# Patient Record
Sex: Female | Born: 1935 | Race: White | Hispanic: No | Smoking: Never smoker
Health system: Southern US, Community
[De-identification: ages and names within clinical notes are randomized; demographics above are authoritative.]

## PROBLEM LIST (undated history)

## (undated) DIAGNOSIS — I10 Essential (primary) hypertension: Secondary | ICD-10-CM

## (undated) DIAGNOSIS — I509 Heart failure, unspecified: Secondary | ICD-10-CM

## (undated) DIAGNOSIS — G589 Mononeuropathy, unspecified: Secondary | ICD-10-CM

## (undated) DIAGNOSIS — F0390 Unspecified dementia without behavioral disturbance: Secondary | ICD-10-CM

## (undated) DIAGNOSIS — I219 Acute myocardial infarction, unspecified: Secondary | ICD-10-CM

## (undated) DIAGNOSIS — I4891 Unspecified atrial fibrillation: Secondary | ICD-10-CM

## (undated) DIAGNOSIS — E119 Type 2 diabetes mellitus without complications: Secondary | ICD-10-CM

## (undated) DIAGNOSIS — E039 Hypothyroidism, unspecified: Secondary | ICD-10-CM

## (undated) HISTORY — PX: APPENDECTOMY: SHX54

## (undated) HISTORY — PX: BACK SURGERY: SHX140

## (undated) HISTORY — PX: THYROIDECTOMY, PARTIAL: SHX18

---

## 2018-10-14 ENCOUNTER — Observation Stay (HOSPITAL_COMMUNITY): Payer: Medicare Other

## 2018-10-14 ENCOUNTER — Encounter (HOSPITAL_COMMUNITY): Payer: Self-pay | Admitting: Emergency Medicine

## 2018-10-14 ENCOUNTER — Other Ambulatory Visit: Payer: Self-pay

## 2018-10-14 ENCOUNTER — Inpatient Hospital Stay (HOSPITAL_COMMUNITY)
Admission: EM | Admit: 2018-10-14 | Discharge: 2018-10-20 | DRG: 640 | Disposition: A | Discharge status: Home Health | Payer: Medicare Other | Source: Ambulatory Visit | Attending: Internal Medicine | Admitting: Internal Medicine

## 2018-10-14 DIAGNOSIS — T502X5A Adverse effect of carbonic-anhydrase inhibitors, benzothiadiazides and other diuretics, initial encounter: Secondary | ICD-10-CM | POA: Diagnosis present

## 2018-10-14 DIAGNOSIS — R001 Bradycardia, unspecified: Secondary | ICD-10-CM | POA: Diagnosis present

## 2018-10-14 DIAGNOSIS — E039 Hypothyroidism, unspecified: Secondary | ICD-10-CM | POA: Diagnosis present

## 2018-10-14 DIAGNOSIS — G9340 Encephalopathy, unspecified: Secondary | ICD-10-CM | POA: Diagnosis not present

## 2018-10-14 DIAGNOSIS — I5030 Unspecified diastolic (congestive) heart failure: Secondary | ICD-10-CM | POA: Diagnosis present

## 2018-10-14 DIAGNOSIS — Z20828 Contact with and (suspected) exposure to other viral communicable diseases: Secondary | ICD-10-CM | POA: Diagnosis present

## 2018-10-14 DIAGNOSIS — E785 Hyperlipidemia, unspecified: Secondary | ICD-10-CM | POA: Diagnosis present

## 2018-10-14 DIAGNOSIS — Z9181 History of falling: Secondary | ICD-10-CM

## 2018-10-14 DIAGNOSIS — E871 Hypo-osmolality and hyponatremia: Principal | ICD-10-CM | POA: Diagnosis present

## 2018-10-14 DIAGNOSIS — I252 Old myocardial infarction: Secondary | ICD-10-CM

## 2018-10-14 DIAGNOSIS — N183 Chronic kidney disease, stage 3 unspecified: Secondary | ICD-10-CM | POA: Diagnosis present

## 2018-10-14 DIAGNOSIS — G9341 Metabolic encephalopathy: Secondary | ICD-10-CM | POA: Diagnosis present

## 2018-10-14 DIAGNOSIS — I5032 Chronic diastolic (congestive) heart failure: Secondary | ICD-10-CM | POA: Diagnosis present

## 2018-10-14 DIAGNOSIS — W19XXXA Unspecified fall, initial encounter: Secondary | ICD-10-CM

## 2018-10-14 DIAGNOSIS — Z7982 Long term (current) use of aspirin: Secondary | ICD-10-CM

## 2018-10-14 DIAGNOSIS — R634 Abnormal weight loss: Secondary | ICD-10-CM | POA: Diagnosis present

## 2018-10-14 DIAGNOSIS — Z66 Do not resuscitate: Secondary | ICD-10-CM | POA: Diagnosis present

## 2018-10-14 DIAGNOSIS — F0390 Unspecified dementia without behavioral disturbance: Secondary | ICD-10-CM | POA: Diagnosis present

## 2018-10-14 DIAGNOSIS — Z681 Body mass index (BMI) 19 or less, adult: Secondary | ICD-10-CM

## 2018-10-14 DIAGNOSIS — D649 Anemia, unspecified: Secondary | ICD-10-CM | POA: Diagnosis present

## 2018-10-14 DIAGNOSIS — Z79890 Hormone replacement therapy (postmenopausal): Secondary | ICD-10-CM

## 2018-10-14 DIAGNOSIS — I130 Hypertensive heart and chronic kidney disease with heart failure and stage 1 through stage 4 chronic kidney disease, or unspecified chronic kidney disease: Secondary | ICD-10-CM | POA: Diagnosis present

## 2018-10-14 HISTORY — DX: Essential (primary) hypertension: I10

## 2018-10-14 HISTORY — DX: Acute myocardial infarction, unspecified: I21.9

## 2018-10-14 HISTORY — DX: Heart failure, unspecified: I50.9

## 2018-10-14 HISTORY — DX: Mononeuropathy, unspecified: G58.9

## 2018-10-14 HISTORY — DX: Unspecified dementia, unspecified severity, without behavioral disturbance, psychotic disturbance, mood disturbance, and anxiety: F03.90

## 2018-10-14 HISTORY — DX: Hypothyroidism, unspecified: E03.9

## 2018-10-14 LAB — COMPREHENSIVE METABOLIC PANEL
ALT: 17 U/L (ref 0–44)
AST: 38 U/L (ref 15–41)
Albumin: 3.7 g/dL (ref 3.5–5.0)
Alkaline Phosphatase: 53 U/L (ref 38–126)
Anion gap: 11 (ref 5–15)
BUN: 21 mg/dL (ref 8–23)
CO2: 24 mmol/L (ref 22–32)
Calcium: 9.6 mg/dL (ref 8.9–10.3)
Chloride: 82 mmol/L — ABNORMAL LOW (ref 98–111)
Creatinine, Ser: 1.82 mg/dL — ABNORMAL HIGH (ref 0.44–1.00)
GFR calc Af Amer: 28 mL/min — ABNORMAL LOW (ref >60)
GFR calc non Af Amer: 24 mL/min — ABNORMAL LOW (ref >60)
Glucose, Bld: 137 mg/dL — ABNORMAL HIGH (ref 70–99)
Potassium: 5.1 mmol/L (ref 3.5–5.1)
Sodium: 117 mmol/L — CL (ref 135–145)
Total Bilirubin: 0.6 mg/dL (ref 0.3–1.2)
Total Protein: 6.4 g/dL — ABNORMAL LOW (ref 6.5–8.1)

## 2018-10-14 LAB — CBC WITH DIFFERENTIAL/PLATELET
Abs Immature Granulocytes: 0.01 10*3/uL (ref 0.00–0.07)
Basophils Absolute: 0 10*3/uL (ref 0.0–0.1)
Basophils Relative: 0 %
Eosinophils Absolute: 0 10*3/uL (ref 0.0–0.5)
Eosinophils Relative: 1 %
HCT: 31.5 % — ABNORMAL LOW (ref 36.0–46.0)
Hemoglobin: 11.4 g/dL — ABNORMAL LOW (ref 12.0–15.0)
Immature Granulocytes: 0 %
Lymphocytes Relative: 21 %
Lymphs Abs: 1.2 10*3/uL (ref 0.7–4.0)
MCH: 32.4 pg (ref 26.0–34.0)
MCHC: 36.2 g/dL — ABNORMAL HIGH (ref 30.0–36.0)
MCV: 89.5 fL (ref 80.0–100.0)
Monocytes Absolute: 0.5 10*3/uL (ref 0.1–1.0)
Monocytes Relative: 9 %
Neutro Abs: 3.8 10*3/uL (ref 1.7–7.7)
Neutrophils Relative %: 69 %
Platelets: 234 10*3/uL (ref 150–400)
RBC: 3.52 MIL/uL — ABNORMAL LOW (ref 3.87–5.11)
RDW: 11.4 % — ABNORMAL LOW (ref 11.5–15.5)
WBC: 5.5 10*3/uL (ref 4.0–10.5)
nRBC: 0 % (ref 0.0–0.2)

## 2018-10-14 MED ORDER — SODIUM CHLORIDE 0.9 % IV BOLUS
1000.0000 mL | Freq: Once | INTRAVENOUS | Status: AC
  Administered 2018-10-14: 1000 mL via INTRAVENOUS

## 2018-10-14 MED ORDER — VITAMIN B-12 1000 MCG PO TABS
1000.0000 ug | ORAL_TABLET | Freq: Every day | ORAL | Status: DC
  Administered 2018-10-15 – 2018-10-20 (×6): 1000 ug via ORAL
  Filled 2018-10-14 (×6): qty 1

## 2018-10-14 MED ORDER — ONDANSETRON HCL 4 MG PO TABS: 4.0000 mg | ORAL_TABLET | Freq: Four times a day (QID) | ORAL | Status: DC | PRN

## 2018-10-14 MED ORDER — LEVOTHYROXINE SODIUM 75 MCG PO TABS
75.0000 ug | ORAL_TABLET | Freq: Every day | ORAL | Status: DC
  Administered 2018-10-15 – 2018-10-19 (×5): 75 ug via ORAL
  Filled 2018-10-14 (×5): qty 1

## 2018-10-14 MED ORDER — GABAPENTIN 300 MG PO CAPS
300.0000 mg | ORAL_CAPSULE | Freq: Three times a day (TID) | ORAL | Status: DC
  Administered 2018-10-15: 300 mg via ORAL
  Filled 2018-10-14 (×2): qty 1

## 2018-10-14 MED ORDER — OMEGA-3-ACID ETHYL ESTERS 1 G PO CAPS
1.0000 g | ORAL_CAPSULE | Freq: Every day | ORAL | Status: DC
  Administered 2018-10-15 – 2018-10-20 (×6): 1 g via ORAL
  Filled 2018-10-14 (×6): qty 1

## 2018-10-14 MED ORDER — SACCHAROMYCES BOULARDII 250 MG PO CAPS
250.0000 mg | ORAL_CAPSULE | Freq: Two times a day (BID) | ORAL | Status: DC
  Administered 2018-10-15 – 2018-10-20 (×11): 250 mg via ORAL
  Filled 2018-10-14 (×11): qty 1

## 2018-10-14 MED ORDER — SIMVASTATIN 20 MG PO TABS
20.0000 mg | ORAL_TABLET | Freq: Every evening | ORAL | Status: DC
  Administered 2018-10-15 – 2018-10-19 (×5): 20 mg via ORAL
  Filled 2018-10-14 (×5): qty 1

## 2018-10-14 MED ORDER — ACETAMINOPHEN 325 MG PO TABS: 650.0000 mg | ORAL_TABLET | Freq: Four times a day (QID) | ORAL | Status: DC | PRN

## 2018-10-14 MED ORDER — HEPARIN SODIUM (PORCINE) 5000 UNIT/ML IJ SOLN
5000.0000 [IU] | Freq: Three times a day (TID) | INTRAMUSCULAR | Status: DC
  Administered 2018-10-15 – 2018-10-20 (×17): 5000 [IU] via SUBCUTANEOUS
  Filled 2018-10-14 (×15): qty 1

## 2018-10-14 MED ORDER — MIRABEGRON ER 25 MG PO TB24
25.0000 mg | ORAL_TABLET | Freq: Every day | ORAL | Status: DC
  Administered 2018-10-15 – 2018-10-20 (×6): 25 mg via ORAL
  Filled 2018-10-14 (×6): qty 1

## 2018-10-14 MED ORDER — DICLOFENAC SODIUM 1 % TD GEL
2.0000 g | Freq: Every day | TRANSDERMAL | Status: DC | PRN
  Filled 2018-10-14: qty 100

## 2018-10-14 MED ORDER — ACETAMINOPHEN 650 MG RE SUPP: 650.0000 mg | Freq: Four times a day (QID) | RECTAL | Status: DC | PRN

## 2018-10-14 MED ORDER — ASPIRIN 81 MG PO CHEW
81.0000 mg | CHEWABLE_TABLET | Freq: Every day | ORAL | Status: DC
  Administered 2018-10-15 – 2018-10-20 (×6): 81 mg via ORAL
  Filled 2018-10-14 (×6): qty 1

## 2018-10-14 MED ORDER — ONDANSETRON HCL 4 MG/2ML IJ SOLN: 4.0000 mg | Freq: Four times a day (QID) | INTRAMUSCULAR | Status: DC | PRN

## 2018-10-14 MED ORDER — PSYLLIUM 95 % PO PACK
1.0000 | PACK | Freq: Every day | ORAL | Status: DC
  Administered 2018-10-16 – 2018-10-20 (×5): 1 via ORAL
  Filled 2018-10-14 (×7): qty 1

## 2018-10-14 MED ORDER — BENAZEPRIL HCL 20 MG PO TABS
20.0000 mg | ORAL_TABLET | Freq: Every day | ORAL | Status: DC
  Filled 2018-10-14: qty 1

## 2018-10-14 NOTE — H&P (Signed)
History and Physical    Grace Owens ZOX:[id number] DOB: [birth year] DOA: 10/14/2018  PCP: System, Pcp Not In  Patient coming from: Home.  Chief Complaint: Low sodium.  Increasing weakness and confusion.  History obtained from patient's daughter.  HPI: Grace Owens is a 85 y.o. female with history of dementia, diastolic CHF, hypothyroidism, chronic kidney disease stage III and anemia was brought to the ER after patient's labs done by the primary care physician showed low sodium of 115.  As per the patient's daughter patient has been feeling more weak and confused than usual for the last couple weeks and has not been eating well.  Labs done by the primary care physician on September 27, 2018 showed sodium of 125 at that time patient was encouraged to have more liquids.  Patient was taking more free fluid than usual.  Since patient symptoms were getting worse including increasing confusion also had a fall 2 days ago when patient's family came to check on her.  Patient was taken to the primary care physician repeat labs sodium 115.  Creatinine 1.7.  Results are available in care everywhere.  Potassium was 5.5.  Patient was placed on eplerenone about 9 months ago.  Also takes hydrochlorothiazide.  Has not had any nausea vomiting abdominal pain diarrhea chest pain or shortness of breath.  ED Course: In the ER patient is alert awake oriented to her name.  Is moving all extremities.  Labs show sodium of 117 potassium 5.1 creatinine 1.8 hemoglobin 9.4 platelets 234.  Patient was given 1 L fluid bolus in the ER.  And admitted for further management of hyponatremia with encephalopathy weakness and fatigue.  Review of Systems: As per HPI, rest all negative.   Past Medical History:  Diagnosis Date  . CHF (congestive heart failure) (HCC)   . Dementia (HCC)   . Hypertension   . Hypothyroidism   . Myocardial infarct (HCC)   . Pinched nerve     Past Surgical History:  Procedure Laterality Date   . APPENDECTOMY    . BACK SURGERY    . THYROIDECTOMY, PARTIAL       reports that she has never smoked. She has never used smokeless tobacco. No history on file for alcohol and drug.  No Known Allergies  Family History  Problem Relation Age of Onset  . Dementia Neg Hx     Prior to Admission medications   Medication Sig Start Date End Date Taking? Authorizing Provider  acetaminophen (TYLENOL) 650 MG CR tablet Take 650 mg by mouth daily.   Yes [provider]  aspirin 81 MG chewable tablet Chew 81 mg by mouth daily.   Yes [provider]  benazepril (LOTENSIN) 20 MG tablet Take 20 mg by mouth daily.   Yes [provider]  bisoprolol-hydrochlorothiazide (ZIAC) 10-6.25 MG tablet Take 1 tablet by mouth daily.   Yes [provider]  Calcium Carbonate-Vitamin D (CALCIUM-VITAMIN D3 PO) Take 1 tablet by mouth every evening.   Yes [provider]  Coenzyme Q10 (COQ10) 100 MG CAPS Take 1 tablet by mouth daily.   Yes [provider]  diclofenac sodium (VOLTAREN) 1 % GEL Apply 2 g topically daily as needed (apply to left knee for pain).   Yes [provider]  eplerenone (INSPRA) 25 MG tablet Take 25 mg by mouth every evening.   Yes [provider]  gabapentin (NEURONTIN) 300 MG capsule Take 300 mg by mouth 3 (three) times daily.   Yes [provider]  Glucosamine-Chondroitin 250-200 MG TABS Take 1 tablet by mouth daily.   Yes [provider]  levothyroxine (SYNTHROID) 75 MCG tablet Take 75 mcg by mouth at bedtime.   Yes [provider]  LUTEIN PO Take 1 tablet by mouth daily.   Yes [provider]  mirabegron ER (MYRBETRIQ) 25 MG TB24 tablet Take 25 mg by mouth daily.   Yes [provider]  Multiple Vitamin (MULTIVITAMIN WITH MINERALS) TABS tablet Take 1 tablet by mouth daily.   Yes [provider]  Omega-3 Fatty Acids (FISH OIL) 1200 MG CAPS Take 1 capsule by mouth 2 (two)  times daily.   Yes [provider]  OVER THE COUNTER MEDICATION Take 1 tablet by mouth every evening. Medication: Cinnamon Bark   Yes [provider]  Polyethyl Glycol-Propyl Glycol (SYSTANE FREE OP) Place 1 drop into both eyes 2 (two) times daily.   Yes [provider]  Psyllium (METAMUCIL PO) Take 1 tablet by mouth daily.   Yes [provider]  saccharomyces boulardii (FLORASTOR) 250 MG capsule Take 250 mg by mouth 2 (two) times daily.   Yes [provider]  simvastatin (ZOCOR) 20 MG tablet Take 20 mg by mouth every evening.   Yes [provider]  vitamin B-12 (CYANOCOBALAMIN) 1000 MCG tablet Take 1,000 mcg by mouth daily.   Yes [provider]    Physical Exam: Constitutional: Moderately built and nourished. Vitals:   10/14/18 1559 10/14/18 2054 10/14/18 2121  BP: (!) 120/55 (!) 185/76   Pulse: (!) 59 (!) 59   Resp: 16 18   Temp: (!) 97.5 F (36.4 C) 97.8 F (36.6 C)   TempSrc: Oral Oral   SpO2: 100% 100%   Weight:   68 kg  Height:   5\' 10"  (1.778 m)   Eyes: Anicteric no pallor. ENMT: No discharge from the ears eyes nose or mouth. Neck: No mass felt.  No neck rigidity. Respiratory: No rhonchi or crepitations. Cardiovascular: S1-S2 heard. Abdomen: Soft nontender bowel sounds present. Musculoskeletal: No edema. Skin: No rash. Neurologic: Alert awake oriented to her name.  Moves all extremities. Psychiatric: Appears normal per normal affect.   Labs on Admission: I have personally reviewed following labs and imaging studies  CBC: Recent Labs  Lab 10/14/18 1619  WBC 5.5  NEUTROABS 3.8  HGB 11.4*  HCT 31.5*  MCV 89.5  PLT 202   Basic Metabolic Panel: Recent Labs  Lab 10/14/18 1619  NA 117*  K 5.1  CL 82*  CO2 24  GLUCOSE 137*  BUN 21  CREATININE 1.82*  CALCIUM 9.6   GFR: Estimated Creatinine Clearance: 22.1 mL/min (A) (by C-G formula based on SCr of 1.82 mg/dL (H)). Liver Function Tests:  Recent Labs  Lab 10/14/18 1619  AST 38  ALT 17  ALKPHOS 53  BILITOT 0.6  PROT 6.4*  ALBUMIN 3.7   No results for input(s): LIPASE, AMYLASE in the last 168 hours. No results for input(s): AMMONIA in the last 168 hours. Coagulation Profile: No results for input(s): INR, PROTIME in the last 168 hours. Cardiac Enzymes: No results for input(s): CKTOTAL, CKMB, CKMBINDEX, TROPONINI in the last 168 hours. BNP (last 3 results) No results for input(s): PROBNP in the last 8760 hours. HbA1C: No results for input(s): HGBA1C in the last 72 hours. CBG: No results for input(s): GLUCAP in the last 168 hours. Lipid Profile: No results for input(s): CHOL, HDL, LDLCALC, TRIG, CHOLHDL, LDLDIRECT in the last 72 hours. Thyroid Function  Tests: No results for input(s): TSH, T4TOTAL, FREET4, T3FREE, THYROIDAB in the last 72 hours. Anemia Panel: No results for input(s): VITAMINB12, FOLATE, FERRITIN, TIBC, IRON, RETICCTPCT in the last 72 hours. Urine analysis: No results found for: COLORURINE, APPEARANCEUR, LABSPEC, PHURINE, GLUCOSEU, HGBUR, BILIRUBINUR, KETONESUR, PROTEINUR, UROBILINOGEN, NITRITE, LEUKOCYTESUR Sepsis Labs: @LABRCNTIP (procalcitonin:4,lacticidven:4) )No results found for this or any previous visit (from the past 240 hour(s)).   Radiological Exams on Admission: Dg Pelvis 1-2 Views  Result Date: 10/14/2018 CLINICAL DATA:  Unsteadiness on her feet, EXAM: PELVIS - 1-2 VIEW COMPARISON:  None. FINDINGS: There is diffuse osteopenia which limits evaluation. No definite fracture. Bilateral hip osteoarthritis. Degenerative changes in the lower lumbar spine. Dense vascular calcifications. IMPRESSION: Diffuse osteopenia which somewhat limits evaluation. No definite fracture. Electronically Signed   By: Jonna ClarkBindu  Avutu M.D.   On: 10/14/2018 23:22   Dg Chest Port 1 View  Result Date: 10/14/2018 CLINICAL DATA:  Change in mental status EXAM: PORTABLE CHEST 1 VIEW COMPARISON:  None. FINDINGS: There is  cardiomegaly. No large airspace consolidation or pleural effusion. Bilateral shoulder arthropathy seen. IMPRESSION: Mild cardiomegaly.  No acute cardiopulmonary process. Electronically Signed   By: Jonna ClarkBindu  Avutu M.D.   On: 10/14/2018 23:22    EKG: Independently reviewed.  Sinus bradycardia heart rate is around 58 bpm with LBBB.  Assessment/Plan Principal Problem:   Hyponatremia Active Problems:   Dementia (HCC)   Diastolic CHF (HCC)   CKD (chronic kidney disease) stage 3, GFR 30-59 ml/min   Acute encephalopathy    1. Hyponatremia -at this time cause not clear.  Will hold off hydrochlorothiazide and eplerenone.  Patient received 1 L normal saline bolus.  Will recheck basic metabolic panel to see the sodium trends.  In addition we will order urine sodium urine osmolality serum osmolality.  TSH was done by the primary care physician which was around 2.5 today.  Based on the sodium trend and labs ordered we will have further plans.  For now I am holding off any further fluids.  May need to get nephrology input. 2. Acute on chronic kidney disease stage III creatinine in February was around 1.4 has been increasing over the last 2 weeks and is presently around 1.8.  Will hold off lisinopril until creatinine improves.  Patient is also received fluids in the ER. 3. Hypertension we will keep patient on PRN IV hydralazine and continue patient's bisoprolol.  Patient's lisinopril is on hold due to worsening kidney function which can be restarted once patient's creatinine reaches baseline around 1.4.  Hydrochlorothiazide on hold due to hyponatremia. 4. History of diastolic dysfunction takes eplerenone.  Hold hold due to low sodium. 5. Dementia presently mild encephalopathy due to electrolyte derangements. 6. Chronic anemia on iron supplements. 7. Hyperlipidemia on statins.  Note that patient is on gabapentin which may need to be held or lower extremity use if there is further worsening of creatinine.  CT  head and x-ray of the chest and pelvis are pending. COVID-19 test is pending.   DVT prophylaxis: Heparin. Code Status: DNR confirmed with patient's daughter. Family Communication: Patient's daughter. Disposition Plan: To be determined. Consults called: None. Admission status: Observation.   Eduard ClosArshad N Rendi Mapel MD [facility] Pager [phone].  If 7PM-7AM, please contact night-coverage [email or web address] Password [email or web address]  10/14/2018, 11:32 PM

## 2018-10-14 NOTE — ED Notes (Signed)
Date and time results received: 10/14/18 1705   Test: Sodium Critical Value: 117  Name of Provider Notified: Dr. Zenia Resides  Orders Received? Or Actions Taken?: aware

## 2018-10-14 NOTE — ED Triage Notes (Signed)
Pt arrives with family, sent by PCP-family sts that pt has not been acting right for the last week and her doctor rechecked her sodium level today and it was 115. Pt on low sodium diet d/t heart problems. Family says she has been unsteady on her feet and is forgetting how to do personal hygiene.

## 2018-10-15 DIAGNOSIS — E871 Hypo-osmolality and hyponatremia: Secondary | ICD-10-CM | POA: Diagnosis present

## 2018-10-15 DIAGNOSIS — R001 Bradycardia, unspecified: Secondary | ICD-10-CM | POA: Diagnosis present

## 2018-10-15 DIAGNOSIS — T502X5A Adverse effect of carbonic-anhydrase inhibitors, benzothiadiazides and other diuretics, initial encounter: Secondary | ICD-10-CM | POA: Diagnosis present

## 2018-10-15 DIAGNOSIS — Z66 Do not resuscitate: Secondary | ICD-10-CM | POA: Diagnosis present

## 2018-10-15 DIAGNOSIS — N1831 Chronic kidney disease, stage 3a: Secondary | ICD-10-CM | POA: Diagnosis not present

## 2018-10-15 DIAGNOSIS — G9341 Metabolic encephalopathy: Secondary | ICD-10-CM | POA: Diagnosis present

## 2018-10-15 DIAGNOSIS — Z79890 Hormone replacement therapy (postmenopausal): Secondary | ICD-10-CM | POA: Diagnosis not present

## 2018-10-15 DIAGNOSIS — Z9181 History of falling: Secondary | ICD-10-CM | POA: Diagnosis not present

## 2018-10-15 DIAGNOSIS — Z681 Body mass index (BMI) 19 or less, adult: Secondary | ICD-10-CM | POA: Diagnosis not present

## 2018-10-15 DIAGNOSIS — I5032 Chronic diastolic (congestive) heart failure: Secondary | ICD-10-CM | POA: Diagnosis present

## 2018-10-15 DIAGNOSIS — E785 Hyperlipidemia, unspecified: Secondary | ICD-10-CM | POA: Diagnosis present

## 2018-10-15 DIAGNOSIS — R634 Abnormal weight loss: Secondary | ICD-10-CM | POA: Diagnosis present

## 2018-10-15 DIAGNOSIS — F0390 Unspecified dementia without behavioral disturbance: Secondary | ICD-10-CM | POA: Diagnosis present

## 2018-10-15 DIAGNOSIS — D649 Anemia, unspecified: Secondary | ICD-10-CM | POA: Diagnosis present

## 2018-10-15 DIAGNOSIS — Z7982 Long term (current) use of aspirin: Secondary | ICD-10-CM | POA: Diagnosis not present

## 2018-10-15 DIAGNOSIS — Z20828 Contact with and (suspected) exposure to other viral communicable diseases: Secondary | ICD-10-CM | POA: Diagnosis present

## 2018-10-15 DIAGNOSIS — N183 Chronic kidney disease, stage 3 unspecified: Secondary | ICD-10-CM | POA: Diagnosis present

## 2018-10-15 DIAGNOSIS — I130 Hypertensive heart and chronic kidney disease with heart failure and stage 1 through stage 4 chronic kidney disease, or unspecified chronic kidney disease: Secondary | ICD-10-CM | POA: Diagnosis present

## 2018-10-15 DIAGNOSIS — E039 Hypothyroidism, unspecified: Secondary | ICD-10-CM | POA: Diagnosis present

## 2018-10-15 DIAGNOSIS — G9340 Encephalopathy, unspecified: Secondary | ICD-10-CM | POA: Diagnosis not present

## 2018-10-15 DIAGNOSIS — I252 Old myocardial infarction: Secondary | ICD-10-CM | POA: Diagnosis not present

## 2018-10-15 LAB — BASIC METABOLIC PANEL
Anion gap: 14 (ref 5–15)
Anion gap: 14 (ref 5–15)
Anion gap: 11 (ref 5–15)
BUN: 20 mg/dL (ref 8–23)
BUN: 20 mg/dL (ref 8–23)
BUN: 24 mg/dL — ABNORMAL HIGH (ref 8–23)
CO2: 19 mmol/L — ABNORMAL LOW (ref 22–32)
CO2: 19 mmol/L — ABNORMAL LOW (ref 22–32)
CO2: 23 mmol/L (ref 22–32)
Calcium: 8.7 mg/dL — ABNORMAL LOW (ref 8.9–10.3)
Calcium: 9.1 mg/dL (ref 8.9–10.3)
Calcium: 9.1 mg/dL (ref 8.9–10.3)
Chloride: 85 mmol/L — ABNORMAL LOW (ref 98–111)
Chloride: 86 mmol/L — ABNORMAL LOW (ref 98–111)
Chloride: 88 mmol/L — ABNORMAL LOW (ref 98–111)
Creatinine, Ser: 1.65 mg/dL — ABNORMAL HIGH (ref 0.44–1.00)
Creatinine, Ser: 1.64 mg/dL — ABNORMAL HIGH (ref 0.44–1.00)
Creatinine, Ser: 1.8 mg/dL — ABNORMAL HIGH (ref 0.44–1.00)
GFR calc Af Amer: 31 mL/min — ABNORMAL LOW (ref >60)
GFR calc Af Amer: 32 mL/min — ABNORMAL LOW (ref >60)
GFR calc Af Amer: 28 mL/min — ABNORMAL LOW (ref >60)
GFR calc non Af Amer: 27 mL/min — ABNORMAL LOW (ref >60)
GFR calc non Af Amer: 27 mL/min — ABNORMAL LOW (ref >60)
GFR calc non Af Amer: 24 mL/min — ABNORMAL LOW (ref >60)
Glucose, Bld: 103 mg/dL — ABNORMAL HIGH (ref 70–99)
Glucose, Bld: 91 mg/dL (ref 70–99)
Glucose, Bld: 134 mg/dL — ABNORMAL HIGH (ref 70–99)
Potassium: 4 mmol/L (ref 3.5–5.1)
Potassium: 3.9 mmol/L (ref 3.5–5.1)
Potassium: 4 mmol/L (ref 3.5–5.1)
Sodium: 118 mmol/L — CL (ref 135–145)
Sodium: 119 mmol/L — CL (ref 135–145)
Sodium: 122 mmol/L — ABNORMAL LOW (ref 135–145)

## 2018-10-15 LAB — CORTISOL: Cortisol, Plasma: 10.6 ug/dL

## 2018-10-15 LAB — CBC
HCT: 30.5 % — ABNORMAL LOW (ref 36.0–46.0)
Hemoglobin: 10.9 g/dL — ABNORMAL LOW (ref 12.0–15.0)
MCH: 31.8 pg (ref 26.0–34.0)
MCHC: 35.7 g/dL (ref 30.0–36.0)
MCV: 88.9 fL (ref 80.0–100.0)
Platelets: 249 10*3/uL (ref 150–400)
RBC: 3.43 MIL/uL — ABNORMAL LOW (ref 3.87–5.11)
RDW: 11.1 % — ABNORMAL LOW (ref 11.5–15.5)
WBC: 7.1 10*3/uL (ref 4.0–10.5)
nRBC: 0 % (ref 0.0–0.2)

## 2018-10-15 LAB — OSMOLALITY: Osmolality: 247 mOsm/kg — CL (ref 275–295)

## 2018-10-15 LAB — OSMOLALITY, URINE: Osmolality, Ur: 194 mOsm/kg — ABNORMAL LOW (ref 300–900)

## 2018-10-15 LAB — SODIUM: Sodium: 121 mmol/L — ABNORMAL LOW (ref 135–145)

## 2018-10-15 LAB — SODIUM, URINE, RANDOM: Sodium, Ur: 24 mmol/L

## 2018-10-15 LAB — SARS CORONAVIRUS 2 (TAT 6-24 HRS): SARS Coronavirus 2: NEGATIVE

## 2018-10-15 MED ORDER — HYDRALAZINE HCL 20 MG/ML IJ SOLN: 10.0000 mg | INTRAMUSCULAR | Status: DC | PRN

## 2018-10-15 MED ORDER — BISOPROLOL FUMARATE 5 MG PO TABS
10.0000 mg | ORAL_TABLET | Freq: Every day | ORAL | Status: DC
  Administered 2018-10-15 – 2018-10-20 (×6): 10 mg via ORAL
  Filled 2018-10-15 (×6): qty 2

## 2018-10-15 MED ORDER — GABAPENTIN 300 MG PO CAPS
300.0000 mg | ORAL_CAPSULE | Freq: Every day | ORAL | Status: DC
  Administered 2018-10-16 – 2018-10-19 (×4): 300 mg via ORAL
  Filled 2018-10-15 (×4): qty 1

## 2018-10-15 MED ORDER — SODIUM CHLORIDE 0.9 % IV SOLN
INTRAVENOUS | Status: DC
  Administered 2018-10-15 – 2018-10-17 (×2): via INTRAVENOUS

## 2018-10-15 NOTE — Progress Notes (Signed)
MD Rizwan called and stated to hold the NS until Na lab was resulted. NS on hold.  Paulla Fore, RN, BSN

## 2018-10-15 NOTE — Progress Notes (Signed)
PROGRESS NOTE    Grace RamRosemary Lantz   ZOX:[id number]  DOB: 01/08/1927  DOA: 10/14/2018 PCP: System, Pcp Not In   Brief Narrative:  Grace Owens  is a 85 y.o. female with history of dementia, diastolic CHF, hypothyroidism, chronic kidney disease stage III and anemia was brought to the ER after patient's labs done by the primary care physician showed low sodium of 115.  As per the patient's daughter patient has been feeling more weak and confused than usual for the last couple weeks and has not been eating well.  Labs done by the primary care physician on September 27, 2018 showed sodium of 125 at that time patient was encouraged to have more liquids.     Subjective: Confused and not able to give a history.     Assessment & Plan:   Principal Problem:   Hyponatremia -  Acute metabolic encephalopathy - due to poor oral intake and HCTZ - continue normal saline and follow sodiums  Active Problems:   Dementia  - follow for behavioral changes    Diastolic CHF   - no prior ECHO in Epic to confirm this - holding Inspira  HTN - holding Benazepril/HCTZ, Inspira, Benazepril  Hypothyroid - cont Synthroid- check TFTs  CKD (chronic kidney disease) stage 3, GFR 30-59 ml/min - stable   Time spent in minutes:  35 DVT prophylaxis:  Heparin Code Status: DNR Family Communication:  Disposition Plan: f/u sodium levels Consultants:   none Procedures:   none Antimicrobials:  Anti-infectives (From admission, onward)   None       Objective: Vitals:   10/14/18 2121 10/15/18 0238 10/15/18 0615 10/15/18 0911  BP:  (!) 156/67 140/78 (!) 102/53  Pulse:  60 62 61  Resp:  18 18 18   Temp:  98.5 F (36.9 C) 98.4 F (36.9 C) 98.3 F (36.8 C)  TempSrc:  Oral Oral Oral  SpO2:  100% 98% 100%  Weight: 68 kg 61.7 kg    Height: 5\' 10"  (1.778 m)       Intake/Output Summary (Last 24 hours) at 10/15/2018 1137 Last data filed at 10/15/2018 0900 Gross per 24 hour  Intake 530 ml  Output  0 ml  Net 530 ml   Filed Weights   10/14/18 2121 10/15/18 0238  Weight: 68 kg 61.7 kg    Examination: General exam: Appears comfortable  HEENT: PERRLA, oral mucosa moist, no sclera icterus or thrush Respiratory system: Clear to auscultation. Respiratory effort normal. Cardiovascular system: S1 & S2 heard, RRR.   Gastrointestinal system: Abdomen soft, non-tender, nondistended. Normal bowel sounds. Central nervous system: Alert and oriented only to person. No focal neurological deficits. Extremities: No cyanosis, clubbing or edema Skin: No rashes or ulcers Psychiatry:  Mood & affect appropriate.     Data Reviewed: I have personally reviewed following labs and imaging studies  CBC: Recent Labs  Lab 10/14/18 1619 10/15/18 0330  WBC 5.5 7.1  NEUTROABS 3.8  --   HGB 11.4* 10.9*  HCT 31.5* 30.5*  MCV 89.5 88.9  PLT 234 249   Basic Metabolic Panel: Recent Labs  Lab 10/14/18 1619 10/15/18 0330 10/15/18 0634 10/15/18 1050  NA 117* 118* 119* 122*  K 5.1 4.0 3.9 4.0  CL 82* 85* 86* 88*  CO2 24 19* 19* 23  GLUCOSE 137* 103* 91 134*  BUN 21 20 20  24*  CREATININE 1.82* 1.65* 1.64* 1.80*  CALCIUM 9.6 8.7* 9.1 9.1   GFR: Estimated Creatinine Clearance: 20.2 mL/min (A) (by C-G formula based  on SCr of 1.8 mg/dL (H)). Liver Function Tests: Recent Labs  Lab 10/14/18 1619  AST 38  ALT 17  ALKPHOS 53  BILITOT 0.6  PROT 6.4*  ALBUMIN 3.7   No results for input(s): LIPASE, AMYLASE in the last 168 hours. No results for input(s): AMMONIA in the last 168 hours. Coagulation Profile: No results for input(s): INR, PROTIME in the last 168 hours. Cardiac Enzymes: No results for input(s): CKTOTAL, CKMB, CKMBINDEX, TROPONINI in the last 168 hours. BNP (last 3 results) No results for input(s): PROBNP in the last 8760 hours. HbA1C: No results for input(s): HGBA1C in the last 72 hours. CBG: No results for input(s): GLUCAP in the last 168 hours. Lipid Profile: No results for  input(s): CHOL, HDL, LDLCALC, TRIG, CHOLHDL, LDLDIRECT in the last 72 hours. Thyroid Function Tests: No results for input(s): TSH, T4TOTAL, FREET4, T3FREE, THYROIDAB in the last 72 hours. Anemia Panel: No results for input(s): VITAMINB12, FOLATE, FERRITIN, TIBC, IRON, RETICCTPCT in the last 72 hours. Urine analysis: No results found for: COLORURINE, APPEARANCEUR, LABSPEC, PHURINE, GLUCOSEU, HGBUR, BILIRUBINUR, KETONESUR, PROTEINUR, UROBILINOGEN, NITRITE, LEUKOCYTESUR Sepsis Labs: @LABRCNTIP (procalcitonin:4,lacticidven:4) ) Recent Results (from the past 240 hour(s))  SARS CORONAVIRUS 2 (TAT 6-24 HRS) Nasopharyngeal Nasopharyngeal Swab     Status: None   Collection Time: 10/14/18  9:09 PM   Specimen: Nasopharyngeal Swab  Result Value Ref Range Status   SARS Coronavirus 2 NEGATIVE NEGATIVE Final    Comment: (NOTE) SARS-CoV-2 target nucleic acids are NOT DETECTED. The SARS-CoV-2 RNA is generally detectable in upper and lower respiratory specimens during the acute phase of infection. Negative results do not preclude SARS-CoV-2 infection, do not rule out co-infections with other pathogens, and should not be used as the sole basis for treatment or other patient management decisions. Negative results must be combined with clinical observations, patient history, and epidemiological information. The expected result is Negative. Fact Sheet for Patients: SugarRoll.be Fact Sheet for Healthcare Providers: https://www.woods-mathews.com/ This test is not yet approved or cleared by the [place] FDA and  has been authorized for detection and/or diagnosis of SARS-CoV-2 by FDA under an Emergency Use Authorization (EUA). This EUA will remain  in effect (meaning this test can be used) for the duration of the COVID-19 declaration under Section 56 4(b)(1) of the Act, 21 U.S.C. section 360bbb-3(b)(1), unless the authorization is terminated or revoked sooner.  Performed at [facility], [place] [address], [place], [place] [address]          Radiology Studies: Dg Pelvis 1-2 Views  Result Date: 10/14/2018 CLINICAL DATA:  Unsteadiness on her feet, EXAM: PELVIS - 1-2 VIEW COMPARISON:  None. FINDINGS: There is diffuse osteopenia which limits evaluation. No definite fracture. Bilateral hip osteoarthritis. Degenerative changes in the lower lumbar spine. Dense vascular calcifications. IMPRESSION: Diffuse osteopenia which somewhat limits evaluation. No definite fracture. Electronically Signed   By: Prudencio Pair M.D.   On: 10/14/2018 23:22   Ct Head Wo Contrast  Result Date: 10/15/2018 CLINICAL DATA:  Altered level of consciousness EXAM: CT HEAD WITHOUT CONTRAST TECHNIQUE: Contiguous axial images were obtained from the base of the skull through the vertex without intravenous contrast. COMPARISON:  None. FINDINGS: Brain: No evidence of acute territorial infarction, hemorrhage, hydrocephalus,extra-axial collection or mass lesion/mass effect. There is dilatation the ventricles and sulci consistent with age-related atrophy. Low-attenuation changes in the deep white matter consistent with small vessel ischemia. Vascular: No hyperdense vessel or unexpected calcification. Skull: The skull is intact. No fracture or focal lesion identified.  Sinuses/Orbits: The visualized paranasal sinuses and mastoid air cells are clear. The orbits and globes intact. Other: None IMPRESSION: No acute intracranial abnormality. Findings consistent with age related atrophy and chronic small vessel ischemia Electronically Signed   By: Jonna Clark M.D.   On: 10/15/2018 00:39   Dg Chest Port 1 View  Result Date: 10/14/2018 CLINICAL DATA:  Change in mental status EXAM: PORTABLE CHEST 1 VIEW COMPARISON:  None. FINDINGS: There is cardiomegaly. No large airspace consolidation or pleural effusion. Bilateral shoulder arthropathy seen. IMPRESSION: Mild cardiomegaly.  No acute cardiopulmonary  process. Electronically Signed   By: Jonna Clark M.D.   On: 10/14/2018 23:22      Scheduled Meds: . aspirin  81 mg Oral Daily  . bisoprolol  10 mg Oral Daily  . gabapentin  300 mg Oral TID  . heparin  5,000 Units Subcutaneous Q8H  . levothyroxine  75 mcg Oral QHS  . mirabegron ER  25 mg Oral Daily  . omega-3 acid ethyl esters  1 g Oral Daily  . psyllium  1 packet Oral Daily  . saccharomyces boulardii  250 mg Oral BID  . simvastatin  20 mg Oral QPM  . vitamin B-12  1,000 mcg Oral Daily   Continuous Infusions: . sodium chloride 100 mL/hr at 10/15/18 0804     LOS: 0 days      Calvert Cantor, MD [facility] Pager: [email or web address] Password [email or web address] 10/15/2018, 11:37 AM

## 2018-10-15 NOTE — Progress Notes (Signed)
Daughter at bedside clarified that the pt is on Gabapentin 300 mg PO Daily at bedtime and not on 300 mg 3 times daily. Notified pharmacy.   Paulla Fore, RN, BSN.

## 2018-10-15 NOTE — Plan of Care (Signed)
  Problem: Education: Goal: Knowledge of General Education information will improve Description: Including pain rating scale, medication(s)/side effects and non-pharmacologic comfort measures Outcome: Progressing   Problem: Safety: Goal: Ability to remain free from injury will improve Outcome: Progressing   

## 2018-10-15 NOTE — Progress Notes (Signed)
CRITICAL VALUE STICKER  CRITICAL VALUE: Na=119  RECEIVER (on-site recipient of call): [place] NOTIFIED:  10/15/18 0740  MESSENGER (representative from lab):  MD NOTIFIED: Debbe Odea MD  TIME OF NOTIFICATION: 0745  RESPONSE: MD ordered NS @ 100

## 2018-10-15 NOTE — Progress Notes (Signed)
CRITICAL VALUE ALERT  Critical Value:  Sodium level of 118  Date & Time Notied:  10/15/18 at 0421  Provider Notified: [place],NP notified  Orders Received/Actions taken: Waiting for  Provider orders

## 2018-10-15 NOTE — ED Provider Notes (Signed)
[facility] EMERGENCY DEPARTMENT Provider Note   CSN: [id number] Arrival date & time: 10/14/18  1552     History   Chief Complaint Chief Complaint  Patient presents with   Abnormal Lab   Altered Mental Status    HPI Grace Owens is a 85 y.o. female presenting for evaluation of generalized weakness and abnormal labs.  Level 5 caveat due to dementia.  History provided by daughter.  Per daughter, patient has had increased generalized weakness for the past month.  Also reporting significant weight loss in the past month.  Saw their PCP, was told he had an abnormal lab and should come to the ED.  No recent change in medication.  No change in diet or p.o. intake.  No known fevers, chills, shortness of breath, nausea, vomiting, complaints of abdominal pain, change in urination, or abnormal bowel movements.  Additional history obtained from chart review.  Patient with a history of CHF, dementia, hypertension, CKD.     HPI  Past Medical History:  Diagnosis Date   CHF (congestive heart failure) (HCC)    Dementia (HCC)    Hypertension    Hypothyroidism    Myocardial infarct (HCC)    Pinched nerve     Patient Active Problem List   Diagnosis Date Noted   Hyponatremia 10/14/2018   Dementia (HCC) 10/14/2018   Diastolic CHF (HCC) 10/14/2018   CKD (chronic kidney disease) stage 3, GFR 30-59 ml/min 10/14/2018   Acute encephalopathy 10/14/2018    Past Surgical History:  Procedure Laterality Date   APPENDECTOMY     BACK SURGERY     THYROIDECTOMY, PARTIAL       OB History   No obstetric history on file.      Home Medications    Prior to Admission medications   Medication Sig Start Date End Date Taking? Authorizing Provider  acetaminophen (TYLENOL) 650 MG CR tablet Take 650 mg by mouth daily.   Yes [provider]  aspirin 81 MG chewable tablet Chew 81 mg by mouth daily.   Yes [provider]  benazepril (LOTENSIN)  20 MG tablet Take 20 mg by mouth daily.   Yes [provider]  bisoprolol-hydrochlorothiazide (ZIAC) 10-6.25 MG tablet Take 1 tablet by mouth daily.   Yes [provider]  Calcium Carbonate-Vitamin D (CALCIUM-VITAMIN D3 PO) Take 1 tablet by mouth every evening.   Yes [provider]  Coenzyme Q10 (COQ10) 100 MG CAPS Take 1 tablet by mouth daily.   Yes [provider]  diclofenac sodium (VOLTAREN) 1 % GEL Apply 2 g topically daily as needed (apply to left knee for pain).   Yes [provider]  eplerenone (INSPRA) 25 MG tablet Take 25 mg by mouth every evening.   Yes [provider]  gabapentin (NEURONTIN) 300 MG capsule Take 300 mg by mouth 3 (three) times daily.   Yes [provider]  Glucosamine-Chondroitin 250-200 MG TABS Take 1 tablet by mouth daily.   Yes [provider]  levothyroxine (SYNTHROID) 75 MCG tablet Take 75 mcg by mouth at bedtime.   Yes [provider]  LUTEIN PO Take 1 tablet by mouth daily.   Yes [provider]  mirabegron ER (MYRBETRIQ) 25 MG TB24 tablet Take 25 mg by mouth daily.   Yes [provider]  Multiple Vitamin (MULTIVITAMIN WITH MINERALS) TABS tablet Take 1 tablet by mouth daily.   Yes [provider]  Omega-3 Fatty Acids (FISH OIL) 1200 MG CAPS Take  1 capsule by mouth 2 (two) times daily.   Yes [provider]  OVER THE COUNTER MEDICATION Take 1 tablet by mouth every evening. Medication: Cinnamon Bark   Yes [provider]  Polyethyl Glycol-Propyl Glycol (SYSTANE FREE OP) Place 1 drop into both eyes 2 (two) times daily.   Yes [provider]  Psyllium (METAMUCIL PO) Take 1 tablet by mouth daily.   Yes [provider]  saccharomyces boulardii (FLORASTOR) 250 MG capsule Take 250 mg by mouth 2 (two) times daily.   Yes [provider]  simvastatin (ZOCOR) 20 MG tablet Take 20 mg by mouth every evening.   Yes [provider]  vitamin B-12 (CYANOCOBALAMIN) 1000 MCG tablet Take 1,000 mcg by mouth daily.   Yes [provider]    Family History Family History  Problem Relation Age of Onset   Dementia Neg Hx     Social History Social History   Tobacco Use   Smoking status: Never Smoker   Smokeless tobacco: Never Used  Substance Use Topics   Alcohol use: Not on file   Drug use: Not on file     Allergies   Patient has no known allergies.   Review of Systems Review of Systems  Unable to perform ROS: Dementia  Constitutional: Positive for unexpected weight change.  Neurological: Positive for weakness.     Physical Exam Updated Vital Signs BP (!) 185/76 (BP Location: Right Arm)    Pulse (!) 59    Temp 97.8 F (36.6 C) (Oral)    Resp 18    Ht  (1.778 m)    Wt 68 kg    LMP  (LMP Unknown)    SpO2 100%    BMI 21.52 kg/m   Physical Exam Vitals signs and nursing note reviewed.  Constitutional:      General: She is not in acute distress.    Appearance: She is well-developed.     Comments: Elderly female who is pleasantly confused, nontoxic  HENT:     Head: Normocephalic and atraumatic.  Eyes:     Extraocular Movements: Extraocular movements intact.     Conjunctiva/sclera: Conjunctivae normal.     Pupils: Pupils are equal, round, and reactive to light.  Neck:     Musculoskeletal: Normal range of motion and neck supple.  Cardiovascular:     Rate and Rhythm: Normal rate and regular rhythm.     Pulses: Normal pulses.  Pulmonary:     Effort: Pulmonary effort is normal. No respiratory distress.     Breath sounds: Normal breath sounds. No wheezing.  Abdominal:     General: There is no distension.     Palpations: Abdomen is soft. There is no mass.     Tenderness: There is no abdominal tenderness. There is no guarding or rebound.  Musculoskeletal: Normal range of motion.  Skin:    General: Skin is warm and dry.     Capillary Refill: Capillary refill takes less  than 2 seconds.  Neurological:     Comments: Mild tremor, baseline.  Alert to person and place.  Baseline mental status per daughter.      ED Treatments / Results  Labs (all labs ordered are listed, but only abnormal results are displayed) Labs Reviewed  CBC WITH DIFFERENTIAL/PLATELET - Abnormal; Notable for the following components:      Result Value   RBC 3.52 (*)    Hemoglobin 11.4 (*)    HCT 31.5 (*)    MCHC  36.2 (*)    RDW 11.4 (*)    All other components within normal limits  COMPREHENSIVE METABOLIC PANEL - Abnormal; Notable for the following components:   Sodium 117 (*)    Chloride 82 (*)    Glucose, Bld 137 (*)    Creatinine, Ser 1.82 (*)    Total Protein 6.4 (*)    GFR calc non Af Amer 24 (*)    GFR calc Af Amer 28 (*)    All other components within normal limits  SARS CORONAVIRUS 2 (TAT 6-24 HRS)  SODIUM, URINE, RANDOM  OSMOLALITY, URINE  BASIC METABOLIC PANEL  BASIC METABOLIC PANEL  BASIC METABOLIC PANEL  BASIC METABOLIC PANEL  OSMOLALITY  CORTISOL  CBC  CREATININE, SERUM    EKG None  Radiology Dg Pelvis 1-2 Views  Result Date: 10/14/2018 CLINICAL DATA:  Unsteadiness on her feet, EXAM: PELVIS - 1-2 VIEW COMPARISON:  None. FINDINGS: There is diffuse osteopenia which limits evaluation. No definite fracture. Bilateral hip osteoarthritis. Degenerative changes in the lower lumbar spine. Dense vascular calcifications. IMPRESSION: Diffuse osteopenia which somewhat limits evaluation. No definite fracture. Electronically Signed   By: Jonna Clark M.D.   On: 10/14/2018 23:22   Dg Chest Port 1 View  Result Date: 10/14/2018 CLINICAL DATA:  Change in mental status EXAM: PORTABLE CHEST 1 VIEW COMPARISON:  None. FINDINGS: There is cardiomegaly. No large airspace consolidation or pleural effusion. Bilateral shoulder arthropathy seen. IMPRESSION: Mild cardiomegaly.  No acute cardiopulmonary process. Electronically Signed   By: Jonna Clark M.D.   On: 10/14/2018 23:22     Procedures .Critical Care Performed by: Alveria Apley, PA-C Authorized by: Alveria Apley, PA-C   Critical care provider statement:    Critical care time (minutes):  35   Critical care time was exclusive of:  Separately billable procedures and treating other patients and teaching time   Critical care was necessary to treat or prevent imminent or life-threatening deterioration of the following conditions:  Metabolic crisis   Critical care was time spent personally by me on the following activities:  Blood draw for specimens, development of treatment plan with patient or surrogate, examination of patient, obtaining history from patient or surrogate, ordering and performing treatments and interventions, ordering and review of laboratory studies, ordering and review of radiographic studies, pulse oximetry, re-evaluation of patient's condition and review of old charts   I assumed direction of critical care for this patient from another provider in my specialty: no   Comments:     Patient with critically low sodium at 117, requiring normal saline infusion and admission to the hospital.   (including critical care time)  Medications Ordered in ED Medications  aspirin chewable tablet 81 mg (has no administration in time range)  benazepril (LOTENSIN) tablet 20 mg (has no administration in time range)  simvastatin (ZOCOR) tablet 20 mg (has no administration in time range)  levothyroxine (SYNTHROID) tablet 75 mcg (has no administration in time range)  psyllium (HYDROCIL/METAMUCIL) packet 1 packet (has no administration in time range)  saccharomyces boulardii (FLORASTOR) capsule 250 mg (has no administration in time range)  mirabegron ER (MYRBETRIQ) tablet 25 mg (has no administration in time range)  vitamin B-12 (CYANOCOBALAMIN) tablet 1,000 mcg (has no administration in time range)  gabapentin (NEURONTIN) capsule 300 mg (has no administration in time range)  omega-3 acid ethyl esters  (LOVAZA) capsule 1 g (has no administration in time range)  diclofenac sodium (VOLTAREN) 1 % transdermal gel 2 g (has no administration in time  range)  acetaminophen (TYLENOL) tablet 650 mg (has no administration in time range)    Or  acetaminophen (TYLENOL) suppository 650 mg (has no administration in time range)  ondansetron (ZOFRAN) tablet 4 mg (has no administration in time range)    Or  ondansetron (ZOFRAN) injection 4 mg (has no administration in time range)  heparin injection 5,000 Units (has no administration in time range)  sodium chloride 0.9 % bolus 1,000 mL (0 mLs Intravenous Stopped 10/14/18 2331)     Initial Impression / Assessment and Plan / ED Course  I have reviewed the triage vital signs and the nursing notes.  Pertinent labs & imaging results that were available during my care of the patient were reviewed by me and considered in my medical decision making (see chart for details).        Patient presenting for evaluation of generalized weakness and abnormal lab value.  Physical exam shows elderly patient who appears nontoxic.  At baseline mental status.  Labs show critically low sodium at 117.  Otherwise labs are reassuring.  Unknown cause for hyponatremia at this time, per chart review patient is not normally hyponatremic.  Will start normal saline infusion and call for admission.  Discussed with Dr. Hal Hope from [facility], patient to be admitted.   Final Clinical Impressions(s) / ED Diagnoses   Final diagnoses:  Hyponatremia    ED Discharge Orders    None       Franchot Heidelberg, PA-C 10/15/18 0011    Malvin Johns, MD 10/28/18 [phone]

## 2018-10-15 NOTE — Progress Notes (Signed)
CRITICAL VALUE ALERT  Critical Value:  Osmolality of 247  Date & Time Notied:  10/15/18 at 0411  Provider Notified: Kennon Holter, NP notified  Orders Received/Actions taken: Waiting for orders

## 2018-10-15 NOTE — Progress Notes (Signed)
New Admission Note: ? Arrival Method: Stretcher Mental Orientation: Alert to self and time. Disoriented to place and situation. Telemetry: Yes, [id number] Assessment: Completed Skin: Refer to flowsheet IV: Left Forearm Pain: 0 Tubes: None Safety Measures: Safety Fall Prevention Plan discussed with patient. Admission: Completed [facility] [place] Orientation: Patient has been orientated to the room, unit and the staff. Family: None at the bedside at this time Orders have been reviewed and are being implemented. Will continue to monitor the patient. Call light has been placed within reach and bed alarm has been activated.  ? Milagros Loll, RN  Phone Number: [phone]

## 2018-10-15 NOTE — Progress Notes (Signed)
Notified MD Rizwan via [place] page, that pt had a 30 beat run of V Tach. Pt is asymptomatic, pt is sitting up and eating dinner. No change from mental status.   Paulla Fore, RN, BSN.

## 2018-10-15 NOTE — Plan of Care (Signed)
  Problem: Activity: Goal: Risk for activity intolerance will decrease Outcome: Progressing   

## 2018-10-16 DIAGNOSIS — F0390 Unspecified dementia without behavioral disturbance: Secondary | ICD-10-CM

## 2018-10-16 LAB — BASIC METABOLIC PANEL
Anion gap: 8 (ref 5–15)
Anion gap: 8 (ref 5–15)
BUN: 18 mg/dL (ref 8–23)
BUN: 20 mg/dL (ref 8–23)
CO2: 23 mmol/L (ref 22–32)
CO2: 25 mmol/L (ref 22–32)
Calcium: 8.8 mg/dL — ABNORMAL LOW (ref 8.9–10.3)
Calcium: 8.7 mg/dL — ABNORMAL LOW (ref 8.9–10.3)
Chloride: 96 mmol/L — ABNORMAL LOW (ref 98–111)
Chloride: 93 mmol/L — ABNORMAL LOW (ref 98–111)
Creatinine, Ser: 1.41 mg/dL — ABNORMAL HIGH (ref 0.44–1.00)
Creatinine, Ser: 1.5 mg/dL — ABNORMAL HIGH (ref 0.44–1.00)
GFR calc Af Amer: 38 mL/min — ABNORMAL LOW (ref >60)
GFR calc Af Amer: 35 mL/min — ABNORMAL LOW (ref >60)
GFR calc non Af Amer: 33 mL/min — ABNORMAL LOW (ref >60)
GFR calc non Af Amer: 30 mL/min — ABNORMAL LOW (ref >60)
Glucose, Bld: 99 mg/dL (ref 70–99)
Glucose, Bld: 124 mg/dL — ABNORMAL HIGH (ref 70–99)
Potassium: 4.1 mmol/L (ref 3.5–5.1)
Potassium: 4.3 mmol/L (ref 3.5–5.1)
Sodium: 127 mmol/L — ABNORMAL LOW (ref 135–145)
Sodium: 126 mmol/L — ABNORMAL LOW (ref 135–145)

## 2018-10-16 LAB — SODIUM: Sodium: 123 mmol/L — ABNORMAL LOW (ref 135–145)

## 2018-10-16 MED ORDER — SODIUM CHLORIDE 1 G PO TABS
1.0000 g | ORAL_TABLET | Freq: Once | ORAL | Status: AC
  Administered 2018-10-16: 1 g via ORAL
  Filled 2018-10-16: qty 1

## 2018-10-16 NOTE — Progress Notes (Addendum)
Notified MD Rizwan that IV team unable to get an IV access on pt due to poor vasculature. IVF have been on hold due to no IV access.  This RN put in a new order for IV team to assess pt again and asked if midline was an option for pt to help guarantee IV access for pt.      1450 IV placed by charge RN. IVF started and MD Rizwan notified.   Paulla Fore, RN, BSN

## 2018-10-16 NOTE — Progress Notes (Signed)
PROGRESS NOTE    Grace Owens   ION:[id number]  DOB: 1926-08-01  DOA: 10/14/2018 PCP: System, Pcp Not In   Brief Narrative:  Grace Owens  is a 85 y.o. female with history of dementia, diastolic CHF, hypothyroidism, chronic kidney disease stage III and anemia was brought to the ER after patient's labs done by the primary care physician showed low sodium of 115.  As per the patient's daughter patient has been feeling more weak and confused than usual for the last couple weeks and has not been eating well.  Labs done by the primary care physician on September 27, 2018 showed sodium of 125 at that time patient was encouraged to have more liquids.     Subjective: Remains pleasantly confused.    Assessment & Plan:   Principal Problem:   Hyponatremia -  Acute metabolic encephalopathy - due to poor oral intake and HCTZ - sodium improved to 127 today -plan to continue normal saline and follow sodiums  Active Problems:   Dementia  - follow for behavioral changes    Diastolic CHF   - no prior ECHO in Epic to confirm this - holding Inspira  HTN - holding Benazepril/HCTZ, Inspira, Benazepril  Hypothyroid - cont Synthroid- check TFTs  CKD (chronic kidney disease) stage 3, GFR 30-59 ml/min - stable   Time spent in minutes:  35 DVT prophylaxis:  Heparin Code Status: DNR Family Communication:  Disposition Plan: f/u sodium levels Consultants:   none Procedures:   none Antimicrobials:  Anti-infectives (From admission, onward)   None       Objective: Vitals:   10/15/18 2349 10/16/18 0442 10/16/18 0918 10/16/18 1655  BP:  (!) 139/45 116/64 (!) 120/47  Pulse: (!) 55 (!) 59 (!) 52 (!) 53  Resp:  18 18 18   Temp:  98.4 F (36.9 C) (!) 97.5 F (36.4 C) 98.1 F (36.7 C)  TempSrc:  Oral Oral Oral  SpO2: 97% 100% 100% (!) 87%  Weight:      Height:        Intake/Output Summary (Last 24 hours) at 10/16/2018 1721 Last data filed at 10/16/2018 1711 Gross per 24  hour  Intake 400 ml  Output 975 ml  Net -575 ml   Filed Weights   10/14/18 2121 10/15/18 0238  Weight: 68 kg 61.7 kg    Examination: General exam: Appears comfortable  HEENT: PERRLA, oral mucosa moist, no sclera icterus or thrush Respiratory system: Clear to auscultation. Respiratory effort normal. Cardiovascular system: S1 & S2 heard,  No murmurs  Gastrointestinal system: Abdomen soft, non-tender, nondistended. Normal bowel sounds   Central nervous system: Alert and oriented only to person. No focal neurological deficits. Extremities: No cyanosis, clubbing or edema Skin: No rashes or ulcers Psychiatry:  Mood & affect appropriate.   Data Reviewed: I have personally reviewed following labs and imaging studies  CBC: Recent Labs  Lab 10/14/18 1619 10/15/18 0330  WBC 5.5 7.1  NEUTROABS 3.8  --   HGB 11.4* 10.9*  HCT 31.5* 30.5*  MCV 89.5 88.9  PLT 234 244   Basic Metabolic Panel: Recent Labs  Lab 10/15/18 0330 10/15/18 0634 10/15/18 1050 10/15/18 1604 10/15/18 2352 10/16/18 0814 10/16/18 1633  NA 118* 119* 122* 121* 123* 127* 126*  K 4.0 3.9 4.0  --   --  4.1 4.3  CL 85* 86* 88*  --   --  96* 93*  CO2 19* 19* 23  --   --  23 25  GLUCOSE 103* 91  134*  --   --  99 124*  BUN 20 20 24*  --   --  18 20  CREATININE 1.65* 1.64* 1.80*  --   --  1.41* 1.50*  CALCIUM 8.7* 9.1 9.1  --   --  8.8* 8.7*   GFR: Estimated Creatinine Clearance: 24.3 mL/min (A) (by C-G formula based on SCr of 1.5 mg/dL (H)). Liver Function Tests: Recent Labs  Lab 10/14/18 1619  AST 38  ALT 17  ALKPHOS 53  BILITOT 0.6  PROT 6.4*  ALBUMIN 3.7   No results for input(s): LIPASE, AMYLASE in the last 168 hours. No results for input(s): AMMONIA in the last 168 hours. Coagulation Profile: No results for input(s): INR, PROTIME in the last 168 hours. Cardiac Enzymes: No results for input(s): CKTOTAL, CKMB, CKMBINDEX, TROPONINI in the last 168 hours. BNP (last 3 results) No results for  input(s): PROBNP in the last 8760 hours. HbA1C: No results for input(s): HGBA1C in the last 72 hours. CBG: No results for input(s): GLUCAP in the last 168 hours. Lipid Profile: No results for input(s): CHOL, HDL, LDLCALC, TRIG, CHOLHDL, LDLDIRECT in the last 72 hours. Thyroid Function Tests: No results for input(s): TSH, T4TOTAL, FREET4, T3FREE, THYROIDAB in the last 72 hours. Anemia Panel: No results for input(s): VITAMINB12, FOLATE, FERRITIN, TIBC, IRON, RETICCTPCT in the last 72 hours. Urine analysis: No results found for: COLORURINE, APPEARANCEUR, LABSPEC, PHURINE, GLUCOSEU, HGBUR, BILIRUBINUR, KETONESUR, PROTEINUR, UROBILINOGEN, NITRITE, LEUKOCYTESUR Sepsis Labs: @LABRCNTIP (procalcitonin:4,lacticidven:4) ) Recent Results (from the past 240 hour(s))  SARS CORONAVIRUS 2 (TAT 6-24 HRS) Nasopharyngeal Nasopharyngeal Swab     Status: None   Collection Time: 10/14/18  9:09 PM   Specimen: Nasopharyngeal Swab  Result Value Ref Range Status   SARS Coronavirus 2 NEGATIVE NEGATIVE Final    Comment: (NOTE) SARS-CoV-2 target nucleic acids are NOT DETECTED. The SARS-CoV-2 RNA is generally detectable in upper and lower respiratory specimens during the acute phase of infection. Negative results do not preclude SARS-CoV-2 infection, do not rule out co-infections with other pathogens, and should not be used as the sole basis for treatment or other patient management decisions. Negative results must be combined with clinical observations, patient history, and epidemiological information. The expected result is Negative. Fact Sheet for Patients: 12/14/18 Fact Sheet for Healthcare Providers: HairSlick.no This test is not yet approved or cleared by the quierodirigir.com FDA and  has been authorized for detection and/or diagnosis of SARS-CoV-2 by FDA under an Emergency Use Authorization (EUA). This EUA will remain  in effect (meaning this  test can be used) for the duration of the COVID-19 declaration under Section 56 4(b)(1) of the Act, 21 U.S.C. section 360bbb-3(b)(1), unless the authorization is terminated or revoked sooner. Performed at [facility], [address], [place], [place]          Radiology Studies: Dg Pelvis 1-2 Views  Result Date: 10/14/2018 CLINICAL DATA:  Unsteadiness on her feet, EXAM: PELVIS - 1-2 VIEW COMPARISON:  None. FINDINGS: There is diffuse osteopenia which limits evaluation. No definite fracture. Bilateral hip osteoarthritis. Degenerative changes in the lower lumbar spine. Dense vascular calcifications. IMPRESSION: Diffuse osteopenia which somewhat limits evaluation. No definite fracture. Electronically Signed   By: 12/14/2018 M.D.   On: 10/14/2018 23:22   Ct Head Wo Contrast  Result Date: 10/15/2018 CLINICAL DATA:  Altered level of consciousness EXAM: CT HEAD WITHOUT CONTRAST TECHNIQUE: Contiguous axial images were obtained from the base of the skull through the vertex without  intravenous contrast. COMPARISON:  None. FINDINGS: Brain: No evidence of acute territorial infarction, hemorrhage, hydrocephalus,extra-axial collection or mass lesion/mass effect. There is dilatation the ventricles and sulci consistent with age-related atrophy. Low-attenuation changes in the deep white matter consistent with small vessel ischemia. Vascular: No hyperdense vessel or unexpected calcification. Skull: The skull is intact. No fracture or focal lesion identified. Sinuses/Orbits: The visualized paranasal sinuses and mastoid air cells are clear. The orbits and globes intact. Other: None IMPRESSION: No acute intracranial abnormality. Findings consistent with age related atrophy and chronic small vessel ischemia Electronically Signed   By: Jonna ClarkBindu  Avutu M.D.   On: 10/15/2018 00:39   Dg Chest Port 1 View  Result Date: 10/14/2018 CLINICAL DATA:  Change in mental status EXAM: PORTABLE CHEST 1 VIEW  COMPARISON:  None. FINDINGS: There is cardiomegaly. No large airspace consolidation or pleural effusion. Bilateral shoulder arthropathy seen. IMPRESSION: Mild cardiomegaly.  No acute cardiopulmonary process. Electronically Signed   By: Jonna ClarkBindu  Avutu M.D.   On: 10/14/2018 23:22      Scheduled Meds: . aspirin  81 mg Oral Daily  . bisoprolol  10 mg Oral Daily  . gabapentin  300 mg Oral QHS  . heparin  5,000 Units Subcutaneous Q8H  . levothyroxine  75 mcg Oral QHS  . mirabegron ER  25 mg Oral Daily  . omega-3 acid ethyl esters  1 g Oral Daily  . psyllium  1 packet Oral Daily  . saccharomyces boulardii  250 mg Oral BID  . simvastatin  20 mg Oral QPM  . vitamin B-12  1,000 mcg Oral Daily   Continuous Infusions: . sodium chloride 75 mL/hr at 10/16/18 1456     LOS: 1 day      Calvert CantorSaima Janssen Zee, MD [facility] Pager: [email or web address] Password [email or web address] 10/16/2018, 5:21 PM

## 2018-10-17 LAB — BASIC METABOLIC PANEL
Anion gap: 11 (ref 5–15)
Anion gap: 11 (ref 5–15)
BUN: 17 mg/dL (ref 8–23)
BUN: 14 mg/dL (ref 8–23)
CO2: 21 mmol/L — ABNORMAL LOW (ref 22–32)
CO2: 21 mmol/L — ABNORMAL LOW (ref 22–32)
Calcium: 8.9 mg/dL (ref 8.9–10.3)
Calcium: 8.5 mg/dL — ABNORMAL LOW (ref 8.9–10.3)
Chloride: 97 mmol/L — ABNORMAL LOW (ref 98–111)
Chloride: 98 mmol/L (ref 98–111)
Creatinine, Ser: 1.37 mg/dL — ABNORMAL HIGH (ref 0.44–1.00)
Creatinine, Ser: 1.39 mg/dL — ABNORMAL HIGH (ref 0.44–1.00)
GFR calc Af Amer: 39 mL/min — ABNORMAL LOW (ref >60)
GFR calc Af Amer: 39 mL/min — ABNORMAL LOW (ref >60)
GFR calc non Af Amer: 34 mL/min — ABNORMAL LOW (ref >60)
GFR calc non Af Amer: 33 mL/min — ABNORMAL LOW (ref >60)
Glucose, Bld: 98 mg/dL (ref 70–99)
Glucose, Bld: 121 mg/dL — ABNORMAL HIGH (ref 70–99)
Potassium: 3.9 mmol/L (ref 3.5–5.1)
Potassium: 3.8 mmol/L (ref 3.5–5.1)
Sodium: 129 mmol/L — ABNORMAL LOW (ref 135–145)
Sodium: 130 mmol/L — ABNORMAL LOW (ref 135–145)

## 2018-10-17 LAB — SODIUM: Sodium: 131 mmol/L — ABNORMAL LOW (ref 135–145)

## 2018-10-17 NOTE — Evaluation (Signed)
Physical Therapy Evaluation Patient Details Name: Grace Owens MRN: [id number] DOB: 1926-11-29 Today's Date: 10/17/2018   History of Present Illness  Grace Owens  is a 85 y.o. female with history of dementia, diastolic CHF, hypothyroidism, chronic kidney disease stage III and anemia was brought to the ER after patient's labs done by the primary care physician showed low sodium; with encephalopathy as well  Clinical Impression   Pt admitted with above diagnosis. Not quite sure of PLOF, as pt is not a reliable historian; Tells me she lives with her son, and she doesn't walk much; Presents to PT with decr functional mobility, incr fall risk, significant posterior lean effecting mobiltiy;  Pt currently with functional limitations due to the deficits listed below (see PT Problem List). Pt will benefit from skilled PT to increase their independence and safety with mobility to allow discharge to the venue listed below.    Will need more information about the amount of assist availabel to her at home; If 24 hour reliable assist is available, then I do favor dc back to familiar environment, routine, and caregivers for pts with dementia; if she does not have relaiable assist, we must consider SNF     Follow Up Recommendations SNF    Equipment Recommendations  Rolling walker with 5" wheels;3in1 (PT)    Recommendations for Other Services       Precautions / Restrictions Precautions Precautions: Fall      Mobility  Bed Mobility Overal bed mobility: Needs Assistance Bed Mobility: Supine to Sit     Supine to sit: Mod assist     General bed mobility comments: Cues for technique; initiated movement of LEs to EOB; mod assist to clear LEs from EOB and heavy mod assist to pull to sit  Transfers Overall transfer level: Needs assistance Equipment used: 2 person hand held assist;Rolling walker (2 wheeled) Transfers: Sit to/from [employer] Sit to Stand: Mod assist;+2  safety/equipment Stand pivot transfers: Mod assist;+2 physical assistance       General transfer comment: Heavy mod assist and power up and steady due to significant posterior lean; Difficulty taking steps to [facility], requires heavy mod assist to steady; transferred bed to [facility] then to recliner  Ambulation/Gait                Stairs            Wheelchair Mobility    Modified Rankin (Stroke Patients Only)       Balance                                             Pertinent Vitals/Pain Pain Assessment: Faces Faces Pain Scale: Hurts a little bit Pain Location: Grimace with moving Pain Descriptors / Indicators: Grimacing Pain Intervention(s): Monitored during session    Home Living Family/patient expects to be discharged to:: Unsure                 Additional Comments: Pt is not a reliable historian    Prior Function Level of Independence: Needs assistance         Comments: Unsure of Prior Level of Function; she did at one point say she lives with her son, and she doesn't walk much     Hand Dominance        Extremity/Trunk Assessment   Upper Extremity Assessment Upper Extremity Assessment: Generalized weakness  Lower Extremity Assessment Lower Extremity Assessment: Generalized weakness       Communication   Communication: HOH(somewhat)  Cognition Arousal/Alertness: Awake/alert Behavior During Therapy: WFL for tasks assessed/performed Overall Cognitive Status: No family/caregiver present to determine baseline cognitive functioning                                        General Comments      Exercises     Assessment/Plan    PT Assessment Patient needs continued PT services  PT Problem List Decreased strength;Decreased activity tolerance;Decreased balance;Decreased mobility;Decreased coordination;Decreased cognition;Decreased knowledge of use of DME;Decreased safety awareness;Decreased knowledge of  precautions       PT Treatment Interventions DME instruction;Gait training;Functional mobility training;Stair training;Therapeutic activities;Therapeutic exercise;Balance training;Neuromuscular re-education;Cognitive remediation;Patient/family education    PT Goals (Current goals can be found in the Care Plan section)  Acute Rehab PT Goals Patient Stated Goal: Did not state, but did want her safety mits taken off PT Goal Formulation: Patient unable to participate in goal setting Time For Goal Achievement: 10/31/18 Potential to Achieve Goals: Good    Frequency Min 2X/week   Barriers to discharge Other (comment) Will need more information about the amount of assist availabel to her at home; If 24 hour reliable assist is available, then I do favor dc back to familiar environment, routine, and caregivers for pts with dementia; if she does not have relaiable assist, we must consider SNF    Co-evaluation               AM-PAC PT "6 Clicks" Mobility  Outcome Measure Help needed turning from your back to your side while in a flat bed without using bedrails?: A Little Help needed moving from lying on your back to sitting on the side of a flat bed without using bedrails?: A Lot Help needed moving to and from a bed to a chair (including a wheelchair)?: A Lot Help needed standing up from a chair using your arms (e.g., wheelchair or bedside chair)?: A Lot Help needed to walk in hospital room?: A Lot Help needed climbing 3-5 steps with a railing? : Total 6 Click Score: 12    End of Session Equipment Utilized During Treatment: Gait belt Activity Tolerance: Patient tolerated treatment well Patient left: in chair;with call bell/phone within reach;with chair alarm set;with restraints reapplied Nurse Communication: Mobility status PT Visit Diagnosis: Unsteadiness on feet (R26.81);Other abnormalities of gait and mobility (R26.89)    Time: 2376-2831 PT Time Calculation (min) (ACUTE ONLY): 23  min   Charges:   PT Evaluation $PT Eval Moderate Complexity: 1 Mod PT Treatments $Therapeutic Activity: 8-22 mins        Roney Marion, PT  Acute Rehabilitation Services Pager [phone] Office [phone]   Colletta [place] 10/17/2018, 11:11 AM

## 2018-10-17 NOTE — Progress Notes (Signed)
Patient pulled her IV out again.Patient removed bandage off and hand mittens. Will restart IVF.

## 2018-10-17 NOTE — Plan of Care (Signed)
  Problem: Safety: Goal: Ability to remain free from injury will improve Outcome: Progressing   

## 2018-10-17 NOTE — Progress Notes (Signed)
PROGRESS NOTE    Grace RamRosemary Moates   ZOX:[id number]  DOB: 12/08/1926  DOA: 10/14/2018 PCP: System, Pcp Not In   Brief Narrative:  Grace Owens  is a 85 y.o. female with history of dementia, diastolic CHF, hypothyroidism, chronic kidney disease stage III and anemia was brought to the ER after patient's labs done by the primary care physician showed low sodium of 115.  As per the patient's daughter patient has been feeling more weak and confused than usual for the last couple weeks and has not been eating well.  Labs done by the primary care physician on September 27, 2018 showed sodium of 125 at that time patient was encouraged to have more liquids.     Subjective: No complaints.     Assessment & Plan:   Principal Problem:   Hyponatremia -  Acute metabolic encephalopathy - due to poor oral intake and HCTZ- d/c HCTZ - sodium improved to 130 today -plan to continue normal saline and follow sodiums  Active Problems:   Dementia  - follow for behavioral changes    Diastolic CHF   - no prior ECHO in Epic to confirm this - holding Inspira  HTN - holding Benazepril/HCTZ, Inspira, Benazepril  Hypothyroid - cont Synthroid- check TFTs  CKD (chronic kidney disease) stage 3, GFR 30-59 ml/min - stable   Time spent in minutes:  35 DVT prophylaxis:  Heparin Code Status: DNR Family Communication:  Disposition Plan: f/u sodium levels Consultants:   none Procedures:   none Antimicrobials:  Anti-infectives (From admission, onward)   None       Objective: Vitals:   10/16/18 1927 10/17/18 0553 10/17/18 0823 10/17/18 1240  BP: (!) 145/49 (!) 131/56 (!) 161/60 122/68  Pulse: (!) 58 (!) 57 (!) 56 (!) 58  Resp: 18 18 18 16   Temp: 98.3 F (36.8 C) 98.5 F (36.9 C) 98.2 F (36.8 C) 98.5 F (36.9 C)  TempSrc: Oral Oral Oral Oral  SpO2: 100% 100% 99% 100%  Weight:  56.4 kg    Height:        Intake/Output Summary (Last 24 hours) at 10/17/2018 1547 Last data filed at  10/17/2018 1154 Gross per 24 hour  Intake 2036.66 ml  Output 450 ml  Net 1586.66 ml   Filed Weights   10/14/18 2121 10/15/18 0238 10/17/18 0553  Weight: 68 kg 61.7 kg 56.4 kg    Examination: General exam: Appears comfortable  HEENT: PERRLA, oral mucosa moist, no sclera icterus or thrush Respiratory system: Clear to auscultation. Respiratory effort normal. Cardiovascular system: S1 & S2 heard,  No murmurs  Gastrointestinal system: Abdomen soft, non-tender, nondistended. Normal bowel sounds   Central nervous system: Alert and oriented only to person. No focal neurological deficits. Extremities: No cyanosis, clubbing or edema Skin: No rashes or ulcers Psychiatry:  Mood & affect appropriate.   Data Reviewed: I have personally reviewed following labs and imaging studies  CBC: Recent Labs  Lab 10/14/18 1619 10/15/18 0330  WBC 5.5 7.1  NEUTROABS 3.8  --   HGB 11.4* 10.9*  HCT 31.5* 30.5*  MCV 89.5 88.9  PLT 234 249   Basic Metabolic Panel: Recent Labs  Lab 10/15/18 1050  10/15/18 2352 10/16/18 0814 10/16/18 1633 10/17/18 0429 10/17/18 1213  NA 122*   < > 123* 127* 126* 129* 130*  K 4.0  --   --  4.1 4.3 3.9 3.8  CL 88*  --   --  96* 93* 97* 98  CO2 23  --   --  23 25 21* 21*  GLUCOSE 134*  --   --  99 124* 98 121*  BUN 24*  --   --  18 20 17 14   CREATININE 1.80*  --   --  1.41* 1.50* 1.37* 1.39*  CALCIUM 9.1  --   --  8.8* 8.7* 8.9 8.5*   < > = values in this interval not displayed.   GFR: Estimated Creatinine Clearance: 24 mL/min (A) (by C-G formula based on SCr of 1.39 mg/dL (H)). Liver Function Tests: Recent Labs  Lab 10/14/18 1619  AST 38  ALT 17  ALKPHOS 53  BILITOT 0.6  PROT 6.4*  ALBUMIN 3.7   No results for input(s): LIPASE, AMYLASE in the last 168 hours. No results for input(s): AMMONIA in the last 168 hours. Coagulation Profile: No results for input(s): INR, PROTIME in the last 168 hours. Cardiac Enzymes: No results for input(s): CKTOTAL,  CKMB, CKMBINDEX, TROPONINI in the last 168 hours. BNP (last 3 results) No results for input(s): PROBNP in the last 8760 hours. HbA1C: No results for input(s): HGBA1C in the last 72 hours. CBG: No results for input(s): GLUCAP in the last 168 hours. Lipid Profile: No results for input(s): CHOL, HDL, LDLCALC, TRIG, CHOLHDL, LDLDIRECT in the last 72 hours. Thyroid Function Tests: No results for input(s): TSH, T4TOTAL, FREET4, T3FREE, THYROIDAB in the last 72 hours. Anemia Panel: No results for input(s): VITAMINB12, FOLATE, FERRITIN, TIBC, IRON, RETICCTPCT in the last 72 hours. Urine analysis: No results found for: COLORURINE, APPEARANCEUR, LABSPEC, PHURINE, GLUCOSEU, HGBUR, BILIRUBINUR, KETONESUR, PROTEINUR, UROBILINOGEN, NITRITE, LEUKOCYTESUR Sepsis Labs: @LABRCNTIP (procalcitonin:4,lacticidven:4) ) Recent Results (from the past 240 hour(s))  SARS CORONAVIRUS 2 (TAT 6-24 HRS) Nasopharyngeal Nasopharyngeal Swab     Status: None   Collection Time: 10/14/18  9:09 PM   Specimen: Nasopharyngeal Swab  Result Value Ref Range Status   SARS Coronavirus 2 NEGATIVE NEGATIVE Final    Comment: (NOTE) SARS-CoV-2 target nucleic acids are NOT DETECTED. The SARS-CoV-2 RNA is generally detectable in upper and lower respiratory specimens during the acute phase of infection. Negative results do not preclude SARS-CoV-2 infection, do not rule out co-infections with other pathogens, and should not be used as the sole basis for treatment or other patient management decisions. Negative results must be combined with clinical observations, patient history, and epidemiological information. The expected result is Negative. Fact Sheet for Patients: Fact Sheet for Healthcare Providers: 12/14/18 This test is not yet approved or cleared by the [place] FDA and  has been authorized for detection and/or diagnosis of SARS-CoV-2 by FDA  under an Emergency Use Authorization (EUA). This EUA will remain  in effect (meaning this test can be used) for the duration of the COVID-19 declaration under Section 56 4(b)(1) of the Act, 21 U.S.C. section 360bbb-3(b)(1), unless the authorization is terminated or revoked sooner. Performed at [facility], [address] [place], [address] [place]          Radiology Studies: No results found.    Scheduled Meds: . aspirin  81 mg Oral Daily  . bisoprolol  10 mg Oral Daily  . gabapentin  300 mg Oral QHS  . heparin  5,000 Units Subcutaneous Q8H  . levothyroxine  75 mcg Oral QHS  . mirabegron ER  25 mg Oral Daily  . omega-3 acid ethyl esters  1 g Oral Daily  . psyllium  1 packet Oral Daily  . saccharomyces boulardii  250 mg Oral BID  . simvastatin  20 mg  Oral QPM  . vitamin B-12  1,000 mcg Oral Daily   Continuous Infusions: . sodium chloride 75 mL/hr at 10/17/18 1539     LOS: 2 days      Debbe Odea, MD [facility] Pager: [email or web address] Password [email or web address] 10/17/2018, 3:47 PM

## 2018-10-18 LAB — TSH: TSH: 3.348 u[IU]/mL (ref 0.350–4.500)

## 2018-10-18 LAB — T4, FREE: Free T4: 1.5 ng/dL — ABNORMAL HIGH (ref 0.61–1.12)

## 2018-10-18 LAB — SODIUM
Sodium: 130 mmol/L — ABNORMAL LOW (ref 135–145)
Sodium: 131 mmol/L — ABNORMAL LOW (ref 135–145)

## 2018-10-18 NOTE — Care Management Important Message (Signed)
Important Message  Patient Details  Name: Grace Owens MRN: [id number] Date of Birth: 1926/08/28   Medicare Important Message Given:  Yes     Cherisa Brucker 10/18/2018, 1:55 PM

## 2018-10-18 NOTE — Plan of Care (Signed)
  Problem: Nutrition: Goal: Adequate nutrition will be maintained Outcome: Progressing   Problem: Safety: Goal: Ability to remain free from injury will improve Outcome: Progressing   

## 2018-10-18 NOTE — TOC Progression Note (Signed)
Transition of Care [facility]) - Progression Note    Patient Details  Name: Grace Owens MRN: [id number] Date of Birth: Aug 26, 1926  Transition of Care [facility]) CM/SW Contact  Sharlet Salina Mila Homer, LCSW Phone Number: 10/18/2018, 10:45 AM  Clinical Narrative:  Call made to patient's daughter, Grace Owens ([phone]) regarding patient's discharge plan and SNF recommendation. [name] explained that under different circumstances they would consider ST rehab in a skilled nursing facility, however due to [place], her mother will return to her brother home. Daughter asked about [facility] services and they have never had [place] for patient, but would be open to having services. Daughter advised that a nurse case manager will be in contact with her.   Expected Discharge Plan: [place] Barriers to Discharge: Continued Medical Work up  Expected Discharge Plan and Services Expected Discharge Plan: [place]   Discharge Planning Services: CM Consult Post Acute Care Choice: [place] arrangements for the past 2 months: Single Family Home                 DME Arranged: 3-N-1                  Social Determinants of Health (SDOH) Interventions  No SDOH interventions needed or requested at this time.  Readmission Risk Interventions No flowsheet data found.

## 2018-10-18 NOTE — Plan of Care (Signed)
  Problem: Education: Goal: Knowledge of General Education information will improve Description: Including pain rating scale, medication(s)/side effects and non-pharmacologic comfort measures Outcome: Progressing   Problem: Activity: Goal: Risk for activity intolerance will decrease Outcome: Progressing   

## 2018-10-18 NOTE — TOC Initial Note (Addendum)
Transition of Care [facility]) - Initial/Assessment Note    Patient Details  Name: Grace Owens MRN: [id number] Date of Birth: [birth year]  [facility]) CM/SW Contact:    Marilu Favre, RN Phone Number: 10/18/2018, 10:36 AM  Clinical Narrative:                  Patient from home with family.   PCP Dr Loura Pardon  Spoke to patient's daughter Judson Roch [phone]. At discharge plan is patient will discharge to patient's son and daughter in laws Linus Orn)  home . Address is [address] , [place], [place] [address] .  Tracey phone number [phone] cell, home phone [phone] .  Patient has walker already at home. Judson Roch does want NCM to order 3 in 1. Same done called Zack with [facility] .   Provided Medicare.gov website to Velva Harman reviewed home health agencies ratings. Judson Roch will like [facility] for home health. Judson Roch also would like someone from [facility] to call her to explain how employees are being screen for covid. Called [place] with [facility] .   Family plan to transport patient home in private car.   NCM offered to call [place] [facility] stated no need she will update Tracey.  Messaged MD for home health orders and face to face.   Per MD discharge is tomorrow and he will order home health.  Expected Discharge Plan: [place] Barriers to Discharge: Continued Medical Work up   Patient Goals and CMS Choice Patient states their goals for this hospitalization and ongoing recovery are:: to go home CMS Medicare.gov Compare Post Acute Care list provided to:: Patient Choice offered to / list presented to : Adult Children  Expected Discharge Plan and Services Expected Discharge Plan: [place]   Discharge Planning Services: CM Consult Post Acute Care Choice: [place] arrangements for the past 2 months: Single Family Home                 DME Arranged: 3-N-1                    Prior Living  Arrangements/Services Living arrangements for the past 2 months: Single Family Home Lives with:: Adult Children Patient language and need for interpreter reviewed:: Yes Do you feel safe going back to the place where you live?: Yes      Need for Family Participation in Patient Care: Yes (Comment) Care giver support system in place?: Yes (comment) Current home services: DME Criminal Activity/Legal Involvement Pertinent to Current Situation/Hospitalization: No - Comment as needed  Activities of Daily Living   ADL Screening (condition at time of admission) Patient's cognitive ability adequate to safely complete daily activities?: Yes Is the patient deaf or have difficulty hearing?: No Does the patient have difficulty seeing, even when wearing glasses/contacts?: No Does the patient have difficulty concentrating, remembering, or making decisions?: Yes Patient able to express need for assistance with ADLs?: No Does the patient have difficulty dressing or bathing?: Yes Independently performs ADLs?: No Bathing: Needs assistance Is this a change from baseline?: Pre-admission baseline Toileting: Needs assistance Is this a change from baseline?: Pre-admission baseline In/Out Bed: Needs assistance Is this a change from baseline?: Pre-admission baseline Does the patient have difficulty walking or climbing stairs?: Yes Weakness of Legs: Both Weakness of Arms/Hands: Both  Permission Sought/Granted   Permission granted to share information with : Yes, Verbal Permission Granted  Share  Information with NAME: daughter Maralyn Sago and daughter in law Lissa Hoard           Emotional Assessment Appearance:: Appears stated age     Orientation: : Fluctuating Orientation (Suspected and/or reported [place]) Alcohol / Substance Use: Not Applicable Psych Involvement: No (comment)  Admission diagnosis:  Hyponatremia [E87.1] Fall [W19.XXXA] Patient Active Problem List   Diagnosis Date Noted  .  Hyponatremia 10/14/2018  . Dementia (HCC) 10/14/2018  . Diastolic CHF (HCC) 10/14/2018  . CKD (chronic kidney disease) stage 3, GFR 30-59 ml/min 10/14/2018  . Acute encephalopathy 10/14/2018   PCP:  System, Pcp Not In Pharmacy:   [id number] [place], [place] - [address] [facility] [place] [address] Phone: [phone] Fax: [phone]     Social Determinants of Health (SDOH) Interventions    Readmission Risk Interventions No flowsheet data found.

## 2018-10-18 NOTE — Progress Notes (Addendum)
PROGRESS NOTE        PATIENT DETAILS Name: Grace Owens Age: 85 y.o. Sex: female Date of Birth: February 03, 1926 Admit Date: 10/14/2018 Admitting Physician Eduard Clos, MD YKD:XIPJAS, Pcp Not In  Brief Narrative: Patient is a 85 y.o. female with chronic diastolic heart failure, dementia, hypothyroidism, HTN, CKD stage III-admitted for evaluation of symptomatic hyponatremia.  Subjective: Very pleasantly confused-lying comfortably in bed.  No major issues overnight.  Assessment/Plan: Hyponatremia: Improved-volume status is stable-stop IV fluids and follow sodium levels.  Suspected to be secondary to poor oral intake and HCTZ-although her urine osmolality was lower than her serum osmolarity.  Plan is to fluid restrict-stop IV fluids today-monitor-if sodium levels hold steady-she can probably go home tomorrow.  Acute metabolic encephalopathy: Secondary to above-I suspect she is improving back to baseline.  Chronic diastolic heart failure: Status stable-if diuretic regimen is required-better stick with a loop diuretic given severity of hyponatremia.  HTN: Controlled-continue with Bystolic.  CKD stage III: Creatinine close to usual baseline-follow.  Hypothyroidism: Continue levothyroxine-TSH within normal limits.  Diet: Diet Order            Diet Heart Room service appropriate? Yes; Fluid consistency: Thin  Diet effective now               DVT Prophylaxis: Prophylactic Heparin   Code Status: Full code   Family Communication: None at bedside-daughter over the phone  Disposition Plan: Remain inpatient-but will plan on Home health-with social work-family refused  Antimicrobial agents: Anti-infectives (From admission, onward)   None      Procedures: None  CONSULTS:  None  Time spent: 25- minutes-Greater than 50% of this time was spent in counseling, explanation of diagnosis, planning of further management, and coordination of  care.  MEDICATIONS: Scheduled Meds:  aspirin  81 mg Oral Daily   bisoprolol  10 mg Oral Daily   gabapentin  300 mg Oral QHS   heparin  5,000 Units Subcutaneous Q8H   levothyroxine  75 mcg Oral QHS   mirabegron ER  25 mg Oral Daily   omega-3 acid ethyl esters  1 g Oral Daily   psyllium  1 packet Oral Daily   saccharomyces boulardii  250 mg Oral BID   simvastatin  20 mg Oral QPM   vitamin B-12  1,000 mcg Oral Daily   Continuous Infusions:  sodium chloride 10 mL/hr at 10/18/18 0901   PRN Meds:.acetaminophen **OR** acetaminophen, diclofenac sodium, hydrALAZINE, ondansetron **OR** ondansetron (ZOFRAN) IV   PHYSICAL EXAM: Vital signs: Vitals:   10/17/18 1240 10/17/18 1651 10/17/18 1942 10/18/18 0500  BP: 122/68 (!) 119/56 (!) 148/62 (!) 142/64  Pulse: (!) 58 (!) 54 (!) 58 60  Resp: 16 18 16 18   Temp: 98.5 F (36.9 C) (!) 97.4 F (36.3 C) 98.7 F (37.1 C) 98.5 F (36.9 C)  TempSrc: Oral Oral Oral Oral  SpO2: 100% 92% 100% 99%  Weight:   57.2 kg   Height:       Filed Weights   10/15/18 0238 10/17/18 0553 10/17/18 1942  Weight: 61.7 kg 56.4 kg 57.2 kg   Body mass index is 18.09 kg/m.   Gen Exam:Alert-pleasantly confused-not in any distress HEENT:atraumatic, normocephalic Chest: B/L clear to auscultation anteriorly CVS:S1S2 regular Abdomen:soft non tender, non distended Extremities:no edema Neurology: Non focal Skin: no rash  I have personally reviewed following labs and  imaging studies  LABORATORY DATA: CBC: Recent Labs  Lab 10/14/18 1619 10/15/18 0330  WBC 5.5 7.1  NEUTROABS 3.8  --   HGB 11.4* 10.9*  HCT 31.5* 30.5*  MCV 89.5 88.9  PLT 234 249    Basic Metabolic Panel: Recent Labs  Lab 10/15/18 1050  10/16/18 0814 10/16/18 1633 10/17/18 0429 10/17/18 1213 10/17/18 2034 10/17/18 2332 10/18/18 0657  NA 122*   < > 127* 126* 129* 130* 131* 130* 131*  K 4.0  --  4.1 4.3 3.9 3.8  --   --   --   CL 88*  --  96* 93* 97* 98  --   --   --    CO2 23  --  23 25 21* 21*  --   --   --   GLUCOSE 134*  --  99 124* 98 121*  --   --   --   BUN 24*  --  18 20 17 14   --   --   --   CREATININE 1.80*  --  1.41* 1.50* 1.37* 1.39*  --   --   --   CALCIUM 9.1  --  8.8* 8.7* 8.9 8.5*  --   --   --    < > = values in this interval not displayed.    GFR: Estimated Creatinine Clearance: 24.3 mL/min (A) (by C-G formula based on SCr of 1.39 mg/dL (H)).  Liver Function Tests: Recent Labs  Lab 10/14/18 1619  AST 38  ALT 17  ALKPHOS 53  BILITOT 0.6  PROT 6.4*  ALBUMIN 3.7   No results for input(s): LIPASE, AMYLASE in the last 168 hours. No results for input(s): AMMONIA in the last 168 hours.  Coagulation Profile: No results for input(s): INR, PROTIME in the last 168 hours.  Cardiac Enzymes: No results for input(s): CKTOTAL, CKMB, CKMBINDEX, TROPONINI in the last 168 hours.  BNP (last 3 results) No results for input(s): PROBNP in the last 8760 hours.  HbA1C: No results for input(s): HGBA1C in the last 72 hours.  CBG: No results for input(s): GLUCAP in the last 168 hours.  Lipid Profile: No results for input(s): CHOL, HDL, LDLCALC, TRIG, CHOLHDL, LDLDIRECT in the last 72 hours.  Thyroid Function Tests: Recent Labs    10/18/18 0657  TSH 3.348  FREET4 1.50*    Anemia Panel: No results for input(s): VITAMINB12, FOLATE, FERRITIN, TIBC, IRON, RETICCTPCT in the last 72 hours.  Urine analysis: No results found for: COLORURINE, APPEARANCEUR, LABSPEC, PHURINE, GLUCOSEU, HGBUR, BILIRUBINUR, KETONESUR, PROTEINUR, UROBILINOGEN, NITRITE, LEUKOCYTESUR  Sepsis Labs: Lactic Acid, Venous No results found for: LATICACIDVEN  MICROBIOLOGY: Recent Results (from the past 240 hour(s))  SARS CORONAVIRUS 2 (TAT 6-24 HRS) Nasopharyngeal Nasopharyngeal Swab     Status: None   Collection Time: 10/14/18  9:09 PM   Specimen: Nasopharyngeal Swab  Result Value Ref Range Status   SARS Coronavirus 2 NEGATIVE NEGATIVE Final    Comment:  (NOTE) SARS-CoV-2 target nucleic acids are NOT DETECTED. The SARS-CoV-2 RNA is generally detectable in upper and lower respiratory specimens during the acute phase of infection. Negative results do not preclude SARS-CoV-2 infection, do not rule out co-infections with other pathogens, and should not be used as the sole basis for treatment or other patient management decisions. Negative results must be combined with clinical observations, patient history, and epidemiological information. The expected result is Negative. Fact Sheet for Patients: HairSlick.nohttps://www.fda.gov/media/138098/download Fact Sheet for Healthcare Providers: quierodirigir.comhttps://www.fda.gov/media/138095/download This test is not yet approved  or cleared by the [place] and  has been authorized for detection and/or diagnosis of SARS-CoV-2 by FDA under an Emergency Use Authorization (EUA). This EUA will remain  in effect (meaning this test can be used) for the duration of the COVID-19 declaration under Section 56 4(b)(1) of the Act, 21 U.S.C. section 360bbb-3(b)(1), unless the authorization is terminated or revoked sooner. Performed at [facility], [place] [address] [place], [place] [address]     RADIOLOGY STUDIES/RESULTS: Dg Pelvis 1-2 Views  Result Date: 10/14/2018 CLINICAL DATA:  Unsteadiness on her feet, EXAM: PELVIS - 1-2 VIEW COMPARISON:  None. FINDINGS: There is diffuse osteopenia which limits evaluation. No definite fracture. Bilateral hip osteoarthritis. Degenerative changes in the lower lumbar spine. Dense vascular calcifications. IMPRESSION: Diffuse osteopenia which somewhat limits evaluation. No definite fracture. Electronically Signed   By: Prudencio Pair M.D.   On: 10/14/2018 23:22   Ct Head Wo Contrast  Result Date: 10/15/2018 CLINICAL DATA:  Altered level of consciousness EXAM: CT HEAD WITHOUT CONTRAST TECHNIQUE: Contiguous axial images were obtained from the base of the skull through the vertex without  intravenous contrast. COMPARISON:  None. FINDINGS: Brain: No evidence of acute territorial infarction, hemorrhage, hydrocephalus,extra-axial collection or mass lesion/mass effect. There is dilatation the ventricles and sulci consistent with age-related atrophy. Low-attenuation changes in the deep white matter consistent with small vessel ischemia. Vascular: No hyperdense vessel or unexpected calcification. Skull: The skull is intact. No fracture or focal lesion identified. Sinuses/Orbits: The visualized paranasal sinuses and mastoid air cells are clear. The orbits and globes intact. Other: None IMPRESSION: No acute intracranial abnormality. Findings consistent with age related atrophy and chronic small vessel ischemia Electronically Signed   By: Prudencio Pair M.D.   On: 10/15/2018 00:39   Dg Chest Port 1 View  Result Date: 10/14/2018 CLINICAL DATA:  Change in mental status EXAM: PORTABLE CHEST 1 VIEW COMPARISON:  None. FINDINGS: There is cardiomegaly. No large airspace consolidation or pleural effusion. Bilateral shoulder arthropathy seen. IMPRESSION: Mild cardiomegaly.  No acute cardiopulmonary process. Electronically Signed   By: Prudencio Pair M.D.   On: 10/14/2018 23:22     LOS: 3 days   Oren Binet, MD  [facility]  If 7PM-7AM, please contact night-coverage  Please page via [email or web address]  Go to [email or web address] and use [facility]'s universal password to access. If you do not have the password, please contact the hospital operator.  Locate the [facility] provider you are looking for under [facility] and page to a number that you can be directly reached. If you still have difficulty reaching the provider, please page the [facility] (Director on Call) for the Hospitalists listed on amion for assistance.  10/18/2018, 10:59 AM

## 2018-10-19 DIAGNOSIS — N1831 Chronic kidney disease, stage 3a: Secondary | ICD-10-CM

## 2018-10-19 LAB — BASIC METABOLIC PANEL
Anion gap: 7 (ref 5–15)
BUN: 18 mg/dL (ref 8–23)
CO2: 22 mmol/L (ref 22–32)
Calcium: 8.4 mg/dL — ABNORMAL LOW (ref 8.9–10.3)
Chloride: 102 mmol/L (ref 98–111)
Creatinine, Ser: 1.4 mg/dL — ABNORMAL HIGH (ref 0.44–1.00)
GFR calc Af Amer: 38 mL/min — ABNORMAL LOW (ref >60)
GFR calc non Af Amer: 33 mL/min — ABNORMAL LOW (ref >60)
Glucose, Bld: 97 mg/dL (ref 70–99)
Potassium: 4 mmol/L (ref 3.5–5.1)
Sodium: 131 mmol/L — ABNORMAL LOW (ref 135–145)

## 2018-10-19 NOTE — Progress Notes (Signed)
[facility] PROGRESS NOTE    Progress Note  Grace Owens  ZTI:[id number] DOB: [birth year] DOA: 10/14/2018 PCP: Ezequiel Kayser, MD     Brief Narrative:   Grace Owens is an 85 y.o. female past medical history of chronic diastolic heart failure, dementia, hypothyroidism, chronic kidney disease stage III who comes in for symptomatic hyponatremia.  Assessment/Plan:   Hyponatremia: Multifactorial in the setting of poor oral intake and hydrochlorothiazide. Did her oral intake and her sodium has slowly improved. The rate of improvement of her sodium has slowed down we will continue to fluid restrict her for an additional 24 hours and recheck in the morning.  Acute metabolic encephalopathy: Secondary to hyponatremia, slow to respond and sleepy.  Essential hypertension: Continue current regimen.  Chronic kidney disease stage III Creatinine at baseline.   DVT prophylaxis: lovenox Family Communication:none Disposition Plan/Barrier to D/C: home in am Code Status:     Code Status Orders  (From admission, onward)         Start     Ordered   10/14/18 2332  Do not attempt resuscitation (DNR)  Continuous    Question Answer Comment  In the event of cardiac or respiratory ARREST Do not call a "code blue"   In the event of cardiac or respiratory ARREST Do not perform Intubation, CPR, defibrillation or ACLS   In the event of cardiac or respiratory ARREST Use medication by any route, position, wound care, and other measures to relive pain and suffering. May use oxygen, suction and manual treatment of airway obstruction as needed for comfort.      10/14/18 2332        Code Status History    Date Active Date Inactive Code Status Order ID Comments User Context   10/14/2018 2238 10/14/2018 2332 DNR [id number]  Eduard Clos, MD ED   Advance Care Planning Activity    Advance Directive Documentation     Most Recent Value  Type of Advance Directive  Healthcare Power of  Attorney  Pre-existing out of facility DNR order (yellow form or pink MOST form)  -  "MOST" Form in Place?  -        IV Access:    Peripheral IV   Procedures and diagnostic studies:   No results found.   Medical Consultants:    None.  Anti-Infectives:  None  Subjective:    Grace Owens slow to respond and sleepy.  Objective:    Vitals:   10/18/18 2037 10/19/18 0456 10/19/18 0519 10/19/18 1000  BP: (!) 162/72  (!) 158/74 (!) 148/72  Pulse: 60  62 66  Resp: 14  18 18   Temp: 99.4 F (37.4 C)  98.1 F (36.7 C) 98 F (36.7 C)  TempSrc: Oral  Oral Oral  SpO2: 95%  96% 98%  Weight: 52 kg 52 kg    Height:       SpO2: 98 %   Intake/Output Summary (Last 24 hours) at 10/19/2018 1316 Last data filed at 10/19/2018 0900 Gross per 24 hour  Intake 540 ml  Output 500 ml  Net 40 ml   Filed Weights   10/17/18 1942 10/18/18 2037 10/19/18 0456  Weight: 57.2 kg 52 kg 52 kg    Exam: General exam: In no acute distress. Respiratory system: Good air movement and clear to auscultation. Cardiovascular system: S1 & S2 heard, RRR. No JVD. Gastrointestinal system: Abdomen is nondistended, soft and nontender.  Central nervous system: Alert and oriented. No focal neurological deficits. Extremities: No  pedal edema. Skin: No rashes, lesions or ulcers Psychiatry: Judgement and insight appear normal. Mood & affect appropriate.   Data Reviewed:    Labs: Basic Metabolic Panel: Recent Labs  Lab 10/16/18 0814 10/16/18 1633 10/17/18 0429 10/17/18 1213 10/17/18 2034 10/17/18 2332 10/18/18 0657 10/19/18 0356  NA 127* 126* 129* 130* 131* 130* 131* 131*  K 4.1 4.3 3.9 3.8  --   --   --  4.0  CL 96* 93* 97* 98  --   --   --  102  CO2 23 25 21* 21*  --   --   --  22  GLUCOSE 99 124* 98 121*  --   --   --  97  BUN 18 20 17 14   --   --   --  18  CREATININE 1.41* 1.50* 1.37* 1.39*  --   --   --  1.40*  CALCIUM 8.8* 8.7* 8.9 8.5*  --   --   --  8.4*   GFR Estimated  Creatinine Clearance: 21.9 mL/min (A) (by C-G formula based on SCr of 1.4 mg/dL (H)). Liver Function Tests: Recent Labs  Lab 10/14/18 1619  AST 38  ALT 17  ALKPHOS 53  BILITOT 0.6  PROT 6.4*  ALBUMIN 3.7   No results for input(s): LIPASE, AMYLASE in the last 168 hours. No results for input(s): AMMONIA in the last 168 hours. Coagulation profile No results for input(s): INR, PROTIME in the last 168 hours. COVID-19 Labs  No results for input(s): DDIMER, FERRITIN, LDH, CRP in the last 72 hours.  Lab Results  Component Value Date   [place] NEGATIVE 10/14/2018    CBC: Recent Labs  Lab 10/14/18 1619 10/15/18 0330  WBC 5.5 7.1  NEUTROABS 3.8  --   HGB 11.4* 10.9*  HCT 31.5* 30.5*  MCV 89.5 88.9  PLT 234 249   Cardiac Enzymes: No results for input(s): CKTOTAL, CKMB, CKMBINDEX, TROPONINI in the last 168 hours. BNP (last 3 results) No results for input(s): PROBNP in the last 8760 hours. CBG: No results for input(s): GLUCAP in the last 168 hours. D-Dimer: No results for input(s): DDIMER in the last 72 hours. Hgb A1c: No results for input(s): HGBA1C in the last 72 hours. Lipid Profile: No results for input(s): CHOL, HDL, LDLCALC, TRIG, CHOLHDL, LDLDIRECT in the last 72 hours. Thyroid function studies: Recent Labs    10/18/18 0657  TSH 3.348   Anemia work up: No results for input(s): VITAMINB12, FOLATE, FERRITIN, TIBC, IRON, RETICCTPCT in the last 72 hours. Sepsis Labs: Recent Labs  Lab 10/14/18 1619 10/15/18 0330  WBC 5.5 7.1   Microbiology Recent Results (from the past 240 hour(s))  SARS CORONAVIRUS 2 (TAT 6-24 HRS) Nasopharyngeal Nasopharyngeal Swab     Status: None   Collection Time: 10/14/18  9:09 PM   Specimen: Nasopharyngeal Swab  Result Value Ref Range Status   SARS Coronavirus 2 NEGATIVE NEGATIVE Final    Comment: (NOTE) SARS-CoV-2 target nucleic acids are NOT DETECTED. The SARS-CoV-2 RNA is generally detectable in upper and lower respiratory  specimens during the acute phase of infection. Negative results do not preclude SARS-CoV-2 infection, do not rule out co-infections with other pathogens, and should not be used as the sole basis for treatment or other patient management decisions. Negative results must be combined with clinical observations, patient history, and epidemiological information. The expected result is Negative. Fact Sheet for Patients: SugarRoll.be Fact Sheet for Healthcare Providers: https://www.woods-mathews.com/ This test is not yet approved or  cleared by the [place] FDA and  has been authorized for detection and/or diagnosis of SARS-CoV-2 by FDA under an Emergency Use Authorization (EUA). This EUA will remain  in effect (meaning this test can be used) for the duration of the COVID-19 declaration under Section 56 4(b)(1) of the Act, 21 U.S.C. section 360bbb-3(b)(1), unless the authorization is terminated or revoked sooner. Performed at [facility], [address] [place], [place] [address]      Medications:   . aspirin  81 mg Oral Daily  . bisoprolol  10 mg Oral Daily  . gabapentin  300 mg Oral QHS  . heparin  5,000 Units Subcutaneous Q8H  . levothyroxine  75 mcg Oral QHS  . mirabegron ER  25 mg Oral Daily  . omega-3 acid ethyl esters  1 g Oral Daily  . psyllium  1 packet Oral Daily  . saccharomyces boulardii  250 mg Oral BID  . simvastatin  20 mg Oral QPM  . vitamin B-12  1,000 mcg Oral Daily   Continuous Infusions: . sodium chloride 10 mL/hr at 10/18/18 0901     LOS: 4 days   Grace Owens  [facility]  10/19/2018, 1:16 PM

## 2018-10-19 NOTE — Plan of Care (Signed)
  Problem: Clinical Measurements: Goal: Ability to maintain clinical measurements within normal limits will improve Outcome: Progressing Goal: Will remain free from infection Outcome: Progressing Goal: Diagnostic test results will improve Outcome: Progressing Goal: Cardiovascular complication will be avoided Outcome: Progressing   Problem: Nutrition: Goal: Adequate nutrition will be maintained Outcome: Progressing   Problem: Coping: Goal: Level of anxiety will decrease Outcome: Progressing   Problem: Elimination: Goal: Will not experience complications related to urinary retention Outcome: Progressing   Problem: Pain Managment: Goal: General experience of comfort will improve Outcome: Progressing   Problem: Safety: Goal: Ability to remain free from injury will improve Outcome: Progressing   Problem: Skin Integrity: Goal: Risk for impaired skin integrity will decrease Outcome: Progressing

## 2018-10-19 NOTE — Progress Notes (Signed)
Physical Therapy Treatment Patient Details Name: Grace Owens MRN: [id number] DOB: 06/13/1926 Today's Date: 10/19/2018    History of Present Illness Grace Owens  is a 85 y.o. female with history of dementia, diastolic CHF, hypothyroidism, chronic kidney disease stage III and anemia was brought to the ER after patient's labs done by the primary care physician showed low sodium; with encephalopathy as well    PT Comments    Pt able to tolerate LE therex while sitting EOB as detailed below, however pt reports feeling too fatigued to try standing or transferring OOB this session. Pt performed bed mobility with mod assist and maintained seated balance EOB with supervision. Pt limited this session secondary to fatigue and R hip pain. Will continue to work on OOB activity tolerance, strengthening and balance. Pt continues to benefit from SNF level therapies in order to address these limitations and maximize functional independence with mobility.   Follow Up Recommendations  SNF     Equipment Recommendations  Rolling walker with 5" wheels;3in1 (PT)    Recommendations for Other Services       Precautions / Restrictions Precautions Precautions: Fall Restrictions Weight Bearing Restrictions: No    Mobility  Bed Mobility Overal bed mobility: Needs Assistance Bed Mobility: Supine to Sit;Sit to Supine     Supine to sit: Mod assist Sit to supine: Mod assist   General bed mobility comments: Cues for technique; initiated movement of LEs to EOB; mod assist for LE management and trunk elevation. Once sitting pt able to initiate scooting towards EOB, mod assist needed to assist with scooting  Transfers Overall transfer level: Needs assistance(pt reports she is "too tired" to transfer OOB or perform sit<>stand this session)                       Balance Overall balance assessment: Needs assistance Sitting-balance support: Bilateral upper extremity supported Sitting  balance-Leahy Scale: Poor Sitting balance - Comments: Pt sits EOB this session with supervision while performing LE therex, min-mod assist needed during scooting to EOB                    Cognition Arousal/Alertness: Awake/alert Behavior During Therapy: WFL for tasks assessed/performed Overall Cognitive Status: No family/caregiver present to determine baseline cognitive functioning                                        Exercises General Exercises - Lower Extremity Ankle Circles/Pumps: AROM;Both;10 reps;Seated Long Arc Quad: AROM;Both;Seated;10 reps Hip Flexion/Marching: AROM;Both;10 reps;Seated    General Comments        Pertinent Vitals/Pain Faces Pain Scale: Hurts a little bit Pain Location: Grimace with moving, R hip Pain Descriptors / Indicators: Grimacing;Guarding Pain Intervention(s): Limited activity within patient's tolerance;Monitored during session;Repositioned    Home Living                      Prior Function            PT Goals (current goals can now be found in the care plan section) Acute Rehab PT Goals Potential to Achieve Goals: Good Progress towards PT goals: Progressing toward goals    Frequency    Min 2X/week      PT Plan Current plan remains appropriate    Co-evaluation              AM-PAC PT "6  Clicks" Mobility   Outcome Measure  Help needed turning from your back to your side while in a flat bed without using bedrails?: A Little Help needed moving from lying on your back to sitting on the side of a flat bed without using bedrails?: A Lot Help needed moving to and from a bed to a chair (including a wheelchair)?: A Lot Help needed standing up from a chair using your arms (e.g., wheelchair or bedside chair)?: A Lot Help needed to walk in hospital room?: A Lot Help needed climbing 3-5 steps with a railing? : Total 6 Click Score: 12    End of Session Equipment Utilized During Treatment: Gait  belt Activity Tolerance: Patient limited by fatigue;Patient limited by pain Patient left: in bed;with bed alarm set;with call bell/phone within reach Nurse Communication: Mobility status PT Visit Diagnosis: Unsteadiness on feet (R26.81);Other abnormalities of gait and mobility (R26.89)     Time: 5027-7412 PT Time Calculation (min) (ACUTE ONLY): 12 min  Charges:  $Therapeutic Exercise: 8-22 mins                     Grace Owens, PT, DPT, CSRS Acute Rehab Office [phone]     Grace Owens 10/19/2018, 10:14 AM

## 2018-10-20 LAB — BASIC METABOLIC PANEL
Anion gap: 10 (ref 5–15)
BUN: 16 mg/dL (ref 8–23)
CO2: 25 mmol/L (ref 22–32)
Calcium: 8.9 mg/dL (ref 8.9–10.3)
Chloride: 99 mmol/L (ref 98–111)
Creatinine, Ser: 1.29 mg/dL — ABNORMAL HIGH (ref 0.44–1.00)
GFR calc Af Amer: 42 mL/min — ABNORMAL LOW (ref >60)
GFR calc non Af Amer: 36 mL/min — ABNORMAL LOW (ref >60)
Glucose, Bld: 96 mg/dL (ref 70–99)
Potassium: 4.3 mmol/L (ref 3.5–5.1)
Sodium: 134 mmol/L — ABNORMAL LOW (ref 135–145)

## 2018-10-20 MED ORDER — BISOPROLOL FUMARATE 10 MG PO TABS: 10.0000 mg | ORAL_TABLET | Freq: Every day | ORAL | 3 refills | Status: DC

## 2018-10-20 MED ORDER — EPLERENONE 25 MG PO TABS: 25.0000 mg | ORAL_TABLET | Freq: Every evening | ORAL | Status: DC

## 2018-10-20 MED ORDER — BISOPROLOL FUMARATE 10 MG PO TABS: 10.0000 mg | ORAL_TABLET | Freq: Every day | ORAL | 11 refills | Status: DC

## 2018-10-20 NOTE — Progress Notes (Signed)
Astrid Drafts to be discharged Home per MD order. Discussed prescriptions and follow up appointments with the patient. Prescriptions given to patient; medication list explained in detail. Patient verbalized understanding.  Skin clean, dry and intact without evidence of skin break down, no evidence of skin tears noted. IV catheter discontinued intact. Site without signs and symptoms of complications. Dressing and pressure applied. Pt denies pain at the site currently. No complaints noted.  Patient free of lines, drains, and wounds.   An After Visit Summary (AVS) was printed and given to the patient. Patient escorted via wheelchair, and discharged home via private auto.  Shela Commons, RN

## 2018-10-20 NOTE — Discharge Summary (Signed)
Physician Discharge Summary  Grace Owens GEX:[id number] DOB: 07/07/1926 DOA: 10/14/2018  PCP: Neysa Bonito, MD  Admit date: 10/14/2018 Discharge date: 10/20/2018  Admitted From: Home Disposition:  Home  Recommendations for Outpatient Follow-up:  1. Follow up with PCP in 1-2 weeks 2. Please obtain BMP/CBC in one week   Home Health: Yes Equipment/Devices:None  Discharge Condition:stable CODE STATUS:Full Diet recommendation: Heart Healthy   Brief/Interim Summary: 85 y.o. female past medical history of chronic diastolic heart failure, dementia, hypothyroidism, chronic kidney disease stage III who comes in for symptomatic hyponatremia.  Discharge Diagnoses:  Principal Problem:   Hyponatremia Active Problems:   Dementia (HCC)   Diastolic CHF (HCC)   CKD (chronic kidney disease) stage 3, GFR 30-59 ml/min   Acute encephalopathy Hyponatremia: Likely due to hydrochlorothiazide this was DC'd she was fluid restricted her sodium improved. Physical therapy evaluated the patient recommended skilled nursing facility, due to COVID-19 pandemic the family has decided to take her home with physical therapy. Risk and benefits were explained to the family they still want to take her home.  Acute metabolic encephalopathy: Likely due to hyponatremia resolved now improved.  Essential hypertension: Continue current regimen.  Chronic kidney disease stage III: Creatinine at baseline.  Discharge Instructions  Discharge Instructions    Diet - low sodium heart healthy   Complete by: As directed    Increase activity slowly   Complete by: As directed      Allergies as of 10/20/2018   No Known Allergies     Medication List    STOP taking these medications   bisoprolol-hydrochlorothiazide 10-6.25 MG tablet Commonly known as: ZIAC     TAKE these medications   acetaminophen 650 MG CR tablet Commonly known as: TYLENOL Take 650 mg by mouth daily.   aspirin 81 MG chewable  tablet Chew 81 mg by mouth daily.   benazepril 20 MG tablet Commonly known as: LOTENSIN Take 20 mg by mouth daily.   bisoprolol 10 MG tablet Commonly known as: ZEBETA Take 1 tablet (10 mg total) by mouth daily.   CALCIUM-VITAMIN D3 PO Take 1 tablet by mouth every evening.   CoQ10 100 MG Caps Take 1 tablet by mouth daily.   diclofenac sodium 1 % Gel Commonly known as: VOLTAREN Apply 2 g topically daily as needed (apply to left knee for pain).   eplerenone 25 MG tablet Commonly known as: INSPRA Take 25 mg by mouth every evening.   Fish Oil 1200 MG Caps Take 1 capsule by mouth 2 (two) times daily.   gabapentin 300 MG capsule Commonly known as: NEURONTIN Take 300 mg by mouth 3 (three) times daily.   Glucosamine-Chondroitin 250-200 MG Tabs Take 1 tablet by mouth daily.   levothyroxine 75 MCG tablet Commonly known as: SYNTHROID Take 75 mcg by mouth at bedtime.   LUTEIN PO Take 1 tablet by mouth daily.   METAMUCIL PO Take 1 tablet by mouth daily.   multivitamin with minerals Tabs tablet Take 1 tablet by mouth daily.   Myrbetriq 25 MG Tb24 tablet Generic drug: mirabegron ER Take 25 mg by mouth daily.   OVER THE COUNTER MEDICATION Take 1 tablet by mouth every evening. Medication: Cinnamon Bark   saccharomyces boulardii 250 MG capsule Commonly known as: FLORASTOR Take 250 mg by mouth 2 (two) times daily.   simvastatin 20 MG tablet Commonly known as: ZOCOR Take 20 mg by mouth every evening.   SYSTANE FREE OP Place 1 drop into both eyes 2 (two) times daily.  vitamin B-12 1000 MCG tablet Commonly known as: CYANOCOBALAMIN Take 1,000 mcg by mouth daily.            Durable Medical Equipment  (From admission, onward)         Start     Ordered   10/18/18 1049  For home use only DME 3 n 1  Once     10/18/18 1049         Follow-up Information    [facility] Follow up.   Specialty: Home Health Services Why: physical therapy and  occupational therapy Contact information: [address] [place] [address] [phone]        [facility] Follow up.   Why: bedside commode provided by [facility]) Contact information: [address] [place] [address] [phone]          No Known Allergies  Consultations: None  Procedures/Studies: Dg Pelvis 1-2 Views  Result Date: 10/14/2018 CLINICAL DATA:  Unsteadiness on her feet, EXAM: PELVIS - 1-2 VIEW COMPARISON:  None. FINDINGS: There is diffuse osteopenia which limits evaluation. No definite fracture. Bilateral hip osteoarthritis. Degenerative changes in the lower lumbar spine. Dense vascular calcifications. IMPRESSION: Diffuse osteopenia which somewhat limits evaluation. No definite fracture. Electronically Signed   By: Jonna Clark M.D.   On: 10/14/2018 23:22   Ct Head Wo Contrast  Result Date: 10/15/2018 CLINICAL DATA:  Altered level of consciousness EXAM: CT HEAD WITHOUT CONTRAST TECHNIQUE: Contiguous axial images were obtained from the base of the skull through the vertex without intravenous contrast. COMPARISON:  None. FINDINGS: Brain: No evidence of acute territorial infarction, hemorrhage, hydrocephalus,extra-axial collection or mass lesion/mass effect. There is dilatation the ventricles and sulci consistent with age-related atrophy. Low-attenuation changes in the deep white matter consistent with small vessel ischemia. Vascular: No hyperdense vessel or unexpected calcification. Skull: The skull is intact. No fracture or focal lesion identified. Sinuses/Orbits: The visualized paranasal sinuses and mastoid air cells are clear. The orbits and globes intact. Other: None IMPRESSION: No acute intracranial abnormality. Findings consistent with age related atrophy and chronic small vessel ischemia Electronically Signed   By: Jonna Clark M.D.   On: 10/15/2018 00:39   Dg Chest Port 1 View  Result Date: 10/14/2018 CLINICAL  DATA:  Change in mental status EXAM: PORTABLE CHEST 1 VIEW COMPARISON:  None. FINDINGS: There is cardiomegaly. No large airspace consolidation or pleural effusion. Bilateral shoulder arthropathy seen. IMPRESSION: Mild cardiomegaly.  No acute cardiopulmonary process. Electronically Signed   By: Jonna Clark M.D.   On: 10/14/2018 23:22    (Echo, Carotid, EGD, Colonoscopy, ERCP)    Subjective: No complains  Discharge Exam: Vitals:   10/20/18 0508 10/20/18 0815  BP: (!) 143/59 112/65  Pulse: (!) 51 (!) 48  Resp: 16   Temp: 98.5 F (36.9 C) 98.1 F (36.7 C)  SpO2: 100% 99%   Vitals:   10/19/18 1700 10/19/18 2108 10/20/18 0508 10/20/18 0815  BP: 138/68 (!) 155/54 (!) 143/59 112/65  Pulse: 61 (!) 53 (!) 51 (!) 48  Resp: 18 17 16    Temp: 98 F (36.7 C) 98.4 F (36.9 C) 98.5 F (36.9 C) 98.1 F (36.7 C)  TempSrc: Oral   Oral  SpO2: 98% 100% 100% 99%  Weight:  52.1 kg    Height:        General: Pt is alert, awake, not in acute distress Cardiovascular: RRR, S1/S2 +, no rubs, no gallops Respiratory: CTA bilaterally, no wheezing, no  rhonchi Abdominal: Soft, NT, ND, bowel sounds + Extremities: no edema, no cyanosis    The results of significant diagnostics from this hospitalization (including imaging, microbiology, ancillary and laboratory) are listed below for reference.     Microbiology: Recent Results (from the past 240 hour(s))  SARS CORONAVIRUS 2 (TAT 6-24 HRS) Nasopharyngeal Nasopharyngeal Swab     Status: None   Collection Time: 10/14/18  9:09 PM   Specimen: Nasopharyngeal Swab  Result Value Ref Range Status   SARS Coronavirus 2 NEGATIVE NEGATIVE Final    Comment: (NOTE) SARS-CoV-2 target nucleic acids are NOT DETECTED. The SARS-CoV-2 RNA is generally detectable in upper and lower respiratory specimens during the acute phase of infection. Negative results do not preclude SARS-CoV-2 infection, do not rule out co-infections with other pathogens, and should not be  used as the sole basis for treatment or other patient management decisions. Negative results must be combined with clinical observations, patient history, and epidemiological information. The expected result is Negative. Fact Sheet for Patients: HairSlick.no Fact Sheet for Healthcare Providers: quierodirigir.com This test is not yet approved or cleared by the [place] FDA and  has been authorized for detection and/or diagnosis of SARS-CoV-2 by FDA under an Emergency Use Authorization (EUA). This EUA will remain  in effect (meaning this test can be used) for the duration of the COVID-19 declaration under Section 56 4(b)(1) of the Act, 21 U.S.C. section 360bbb-3(b)(1), unless the authorization is terminated or revoked sooner. Performed at [facility], [address], [place], [place] [address]      Labs: BNP (last 3 results) No results for input(s): BNP in the last 8760 hours. Basic Metabolic Panel: Recent Labs  Lab 10/16/18 1633 10/17/18 0429 10/17/18 1213 10/17/18 2034 10/17/18 2332 10/18/18 0657 10/19/18 0356 10/20/18 0401  NA 126* 129* 130* 131* 130* 131* 131* 134*  K 4.3 3.9 3.8  --   --   --  4.0 4.3  CL 93* 97* 98  --   --   --  102 99  CO2 25 21* 21*  --   --   --  22 25  GLUCOSE 124* 98 121*  --   --   --  97 96  BUN --   --   --  18 16  CREATININE 1.50* 1.37* 1.39*  --   --   --  1.40* 1.29*  CALCIUM 8.7* 8.9 8.5*  --   --   --  8.4* 8.9   Liver Function Tests: Recent Labs  Lab 10/14/18 1619  AST 38  ALT 17  ALKPHOS 53  BILITOT 0.6  PROT 6.4*  ALBUMIN 3.7   No results for input(s): LIPASE, AMYLASE in the last 168 hours. No results for input(s): AMMONIA in the last 168 hours. CBC: Recent Labs  Lab 10/14/18 1619 10/15/18 0330  WBC 5.5 7.1  NEUTROABS 3.8  --   HGB 11.4* 10.9*  HCT 31.5* 30.5*  MCV 89.5 88.9  PLT 234 249   Cardiac Enzymes: No results for input(s):  CKTOTAL, CKMB, CKMBINDEX, TROPONINI in the last 168 hours. BNP: Invalid input(s): POCBNP CBG: No results for input(s): GLUCAP in the last 168 hours. D-Dimer No results for input(s): DDIMER in the last 72 hours. Hgb A1c No results for input(s): HGBA1C in the last 72 hours. Lipid Profile No results for input(s): CHOL, HDL, LDLCALC, TRIG, CHOLHDL, LDLDIRECT in the last 72 hours. Thyroid function studies Recent Labs    10/18/18 0657  TSH 3.348   Anemia work up No results for input(s): VITAMINB12, FOLATE, FERRITIN, TIBC, IRON, RETICCTPCT in the last 72 hours. Urinalysis No results found for: COLORURINE, APPEARANCEUR, LABSPEC, PHURINE, GLUCOSEU, HGBUR, BILIRUBINUR, KETONESUR, PROTEINUR, UROBILINOGEN, NITRITE, LEUKOCYTESUR Sepsis Labs Invalid input(s): PROCALCITONIN,  WBC,  LACTICIDVEN Microbiology Recent Results (from the past 240 hour(s))  SARS CORONAVIRUS 2 (TAT 6-24 HRS) Nasopharyngeal Nasopharyngeal Swab     Status: None   Collection Time: 10/14/18  9:09 PM   Specimen: Nasopharyngeal Swab  Result Value Ref Range Status   SARS Coronavirus 2 NEGATIVE NEGATIVE Final    Comment: (NOTE) SARS-CoV-2 target nucleic acids are NOT DETECTED. The SARS-CoV-2 RNA is generally detectable in upper and lower respiratory specimens during the acute phase of infection. Negative results do not preclude SARS-CoV-2 infection, do not rule out co-infections with other pathogens, and should not be used as the sole basis for treatment or other patient management decisions. Negative results must be combined with clinical observations, patient history, and epidemiological information. The expected result is Negative. Fact Sheet for Patients: HairSlick.nohttps://www.fda.gov/media/138098/download Fact Sheet for Healthcare Providers: quierodirigir.comhttps://www.fda.gov/media/138095/download This test is not yet approved or cleared by the [place] FDA and  has been authorized for detection and/or diagnosis of SARS-CoV-2 by FDA  under an Emergency Use Authorization (EUA). This EUA will remain  in effect (meaning this test can be used) for the duration of the COVID-19 declaration under Section 56 4(b)(1) of the Act, 21 U.S.C. section 360bbb-3(b)(1), unless the authorization is terminated or revoked sooner. Performed at [facility], [address] [place], [place] [address]      Time coordinating discharge: Over 40 minutes  SIGNED:   Marinda ElkAbraham Feliz Ortiz, MD  [facility] 10/20/2018, 11:36 AM Pager   If 7PM-7AM, please contact night-coverage [email or web address] Password [email or web address]

## 2018-10-20 NOTE — TOC Transition Note (Signed)
Transition of Care [facility]) - CM/SW Discharge Note   Patient Details  Name: Grace Owens MRN: [id number] Date of Birth: March 04, 1926  [facility]) CM/SW Contact:  Bartholomew Crews, RN Phone Number: [phone] 10/20/2018, 1:03 PM   Clinical Narrative:    Patient to transition home today with son at [address] [place]. Verified 3N1 delivered to the bedside. Notified [facility] of transtion, and verified orders in for PT, OT. No further TOC needs identified.    Final next level of care: Home w Home Health Services Barriers to Discharge: No Barriers Identified   Patient Goals and CMS Choice Patient states their goals for this hospitalization and ongoing recovery are:: go home with son CMS Medicare.gov Compare Post Acute Care list provided to:: Patient Represenative (must comment) Choice offered to / list presented to : Adult Children  Discharge Placement                       Discharge Plan and Services   Discharge Planning Services: CM Consult Post Acute Care Choice: Home Health          DME Arranged: 3-N-1 DME Agency: [facility] Date DME Agency Contacted: 10/20/18 Time DME Agency Contacted: 0630 Representative spoke with at DME Agency: Thedore Mins HH Arranged: PT, OT [place] Agency: [place] Date [place]: 10/20/18 Time [place]: [place] Representative spoke with at [place]: [place] ([place]) Interventions     Readmission Risk Interventions No flowsheet data found.

## 2019-01-23 ENCOUNTER — Ambulatory Visit: Payer: Medicare Other | Attending: Internal Medicine

## 2019-01-23 DIAGNOSIS — Z23 Encounter for immunization: Secondary | ICD-10-CM | POA: Insufficient documentation

## 2019-01-23 NOTE — Progress Notes (Signed)
   Covid-19 Vaccination Clinic  Name:  Grace Owens    MRN: [id number] DOB: 12-Feb-1926  01/23/2019  [name] was observed post Covid-19 immunization for 30 minutes based on pre-vaccination screening without incidence. She was provided with Vaccine Information Sheet and instruction to access the V-Safe system.   [name] was instructed to call 911 with any severe reactions post vaccine: Marland Kitchen Difficulty breathing  . Swelling of your face and throat  . A fast heartbeat  . A bad rash all over your body  . Dizziness and weakness    Immunizations Administered    Name Date Dose VIS Date Route   Pfizer COVID-19 Vaccine 01/23/2019 12:16 PM 0.3 mL 12/23/2018 Intramuscular   Manufacturer: [employer], [employer]   Lot: [id number]   NDC: [address]-4

## 2019-02-13 ENCOUNTER — Ambulatory Visit: Payer: Medicare Other | Attending: Internal Medicine

## 2019-02-13 DIAGNOSIS — Z23 Encounter for immunization: Secondary | ICD-10-CM | POA: Insufficient documentation

## 2019-02-13 NOTE — Progress Notes (Signed)
   Covid-19 Vaccination Clinic  Name:  Mery Guadalupe    MRN: [id number] DOB: 1926-08-10  02/13/2019  Ms. Malkowski was observed post Covid-19 immunization for 15 minutes without incidence. She was provided with Vaccine Information Sheet and instruction to access the V-Safe system.   Ms. Minish was instructed to call 911 with any severe reactions post vaccine: Marland Kitchen Difficulty breathing  . Swelling of your face and throat  . A fast heartbeat  . A bad rash all over your body  . Dizziness and weakness    Immunizations Administered    Name Date Dose VIS Date Route   Pfizer COVID-19 Vaccine 02/13/2019  9:59 AM 0.3 mL 12/23/2018 Intramuscular   Manufacturer: [employer], [employer]   Lot: [id number]   NDC: [address]-1

## 2019-04-28 ENCOUNTER — Telehealth: Payer: Self-pay | Admitting: Internal Medicine

## 2019-04-28 NOTE — Telephone Encounter (Signed)
Called patient's daughter-in-law French Ana, to schedule the Palliative Consult, no answer - left message with reason for call along with my name and contact number.

## 2019-04-28 NOTE — Telephone Encounter (Signed)
Returned call to patient's daughter-in-law Grace Owens and she was in agreement with Palliative services.  She requested a Zoom Consult so that the patient's daughter Maralyn Sago could be there for it.  I gave her a date and time for the appointment and she will call me back to let me know if this would work for the daughter.

## 2019-05-03 ENCOUNTER — Telehealth: Payer: Self-pay | Admitting: Internal Medicine

## 2019-05-03 NOTE — Telephone Encounter (Signed)
Called patient's daughter-in-law Tracy's home and cell number to f/u with her to confirm the Palliative Consult appointment for 05/05/19 @ 11 AM

## 2019-05-03 NOTE — Telephone Encounter (Signed)
Rec'd call back from [place], patient's daughter-in-law, and have confirmed the Telehealth Palliative Consult for 05/05/19 @ 11 AM.

## 2019-05-05 ENCOUNTER — Encounter: Payer: Self-pay | Admitting: Internal Medicine

## 2019-05-05 ENCOUNTER — Other Ambulatory Visit: Payer: Self-pay

## 2019-05-05 ENCOUNTER — Other Ambulatory Visit: Payer: Medicare Other | Admitting: Internal Medicine

## 2019-05-05 DIAGNOSIS — Z515 Encounter for palliative care: Secondary | ICD-10-CM

## 2019-05-05 DIAGNOSIS — Z7189 Other specified counseling: Secondary | ICD-10-CM

## 2019-05-05 NOTE — Progress Notes (Signed)
April 23rd, 2021 [facility] Palliative Care Consult Note Telephone: [phone]  Fax: [phone]  Due to the current COVID-19 infection/crises, the family prefer, and have given their verbal consent for, a provider visit via telemedicine. HIPPA policies of confidentially were discussed. Video-audio (telehealth) contact was unable to be done due technical barriers from the patient's side.  PATIENT NAME: Grace Owens DOB: 1926-07-20 MRN: [id number]  [place] [address]  PRIMARY CARE PROVIDER:   Neysa Bonito, MD  REFERRING PROVIDER:  Neysa Bonito, MD [address] [place],  [place] [address] [place] PARTY: Shilpa, [place] (D-I-L) [phone] [facility]). Daughter Kevionna Heffler [phone]. (dtr) Lanae Crumbly [phone].  ASSESSMENT / RECOMMENDATIONS:  1. Advance Care Planning: A. Directives: Has Living Will and HCPOA Oceanographer). Has DNR in the home. MOST form discussed. I will sign and mail copy to daughter Judson Roch for her records. MOST details: DNR/DNI, Full Scope of Medical Interventions. Yes to IVFs, Antibiotics. Yes to Tube Feedings but length of time dependent on long term prognosis. B. Goals of Care: Ensure comfortable life. Maximize quality of life.  2. Symptom Management / Cognitive / Functional status: Alert and oriented to self. Recognizes family members but forgets their names. Thought she was home. Pleasantly conversant and usually on topic. Poor short-term memory.  She is participating in PT through [facility]. She walks with walker with stand by guarding assist. Needs assist with dressing, hygiene, and toileting. Occasional incontinence of bladder few times a week.   Current weight is stable at 141lbs. At a height of 5'6" her BMI 23 kg/m2. Very good appetite. Historically problems with IBS D, occasional constipation but recently regular. On daily regimen of 1 cup all bran, 2  Florastor a day, and 15 caps of Metamucil.   She has L hip pain of sciatica (lumbar stenosis), and L knee pain (bone on bone) with ambulation, much improved with rest. Current management with 300mg  gabapentin. Higher doses of this leads to edema. Voltaren gel bid L knee; prn use of heating pad. She has CKD so no NSAIDS. Takes occasional 650mg  Tylenol without much effect.  -trial Tylenol 500mg  2 tabs bid scheduled.   3. Family Supports: Moved from [facility]; now living with son Laverna Peace and D-I-L Olivia Mackie in their home, for the last few weeks. Previous to this lived with daughter Judson Roch for 4 yrs. Some time spent at [facility] in past. Discussed ways to keep patient engaged, most of which D-I-L Olivia Mackie was utilizing (folding clothes, music, family interaction).   4. Follow up Palliative Care Visit: Daughter or D-I-L will call on a prn basis, and if evidence of decline.   I spent 60 minutes providing this consultation from 11am to noon. More than 50% of the time in this consultation was spent coordinating communication.   HISTORY OF PRESENT ILLNESS:  Grace Owens is a 86 y.o.  female with h/o HTN, chronic kidney disease III, bilateral lower extremity edema, HLD, DM type 2, hypothyroid, IBS.  Palliative Care was asked to help address goals of care.   CODE STATUS: DNR  PPS: 50%  HOSPICE ELIGIBILITY/DIAGNOSIS: TBD  PAST MEDICAL HISTORY:  Past Medical History:  Diagnosis Date  . CHF (congestive heart failure) ([place])   . Dementia ([place])   . Hypertension   . Hypothyroidism   . Myocardial infarct ([place])   . Pinched nerve     SOCIAL HX:  Social History   Tobacco Use  .  Smoking status: Never Smoker  . Smokeless tobacco: Never Used  Substance Use Topics  . Alcohol use: Not on file    ALLERGIES: No Known Allergies   PERTINENT MEDICATIONS:  Outpatient Encounter Medications as of 05/05/2019  Medication Sig  . acetaminophen (TYLENOL) 650 MG CR tablet Take 650 mg by mouth daily.  Marland Kitchen aspirin 81 MG  chewable tablet Chew 81 mg by mouth daily.  . benazepril (LOTENSIN) 20 MG tablet Take 20 mg by mouth daily.  . bisoprolol (ZEBETA) 10 MG tablet Take 1 tablet (10 mg total) by mouth daily.  . Calcium Carbonate-Vitamin D (CALCIUM-VITAMIN D3 PO) Take 1 tablet by mouth every evening.  . Coenzyme Q10 (COQ10) 100 MG CAPS Take 1 tablet by mouth daily.  . diclofenac sodium (VOLTAREN) 1 % GEL Apply 2 g topically daily as needed (apply to left knee for pain).  Marland Kitchen eplerenone (INSPRA) 25 MG tablet Take 1 tablet (25 mg total) by mouth every evening.  . gabapentin (NEURONTIN) 300 MG capsule Take 300 mg by mouth 3 (three) times daily.  . Glucosamine-Chondroitin 250-200 MG TABS Take 1 tablet by mouth daily.  Marland Kitchen levothyroxine (SYNTHROID) 75 MCG tablet Take 75 mcg by mouth at bedtime.  . LUTEIN PO Take 1 tablet by mouth daily.  . mirabegron ER (MYRBETRIQ) 25 MG TB24 tablet Take 25 mg by mouth daily.  . Multiple Vitamin (MULTIVITAMIN WITH MINERALS) TABS tablet Take 1 tablet by mouth daily.  . Omega-3 Fatty Acids (FISH OIL) 1200 MG CAPS Take 1 capsule by mouth 2 (two) times daily.  Marland Kitchen OVER THE COUNTER MEDICATION Take 1 tablet by mouth every evening. Medication: Cinnamon Bark  . Polyethyl Glycol-Propyl Glycol (SYSTANE FREE OP) Place 1 drop into both eyes 2 (two) times daily.  . Psyllium (METAMUCIL PO) Take 1 tablet by mouth daily.  Marland Kitchen saccharomyces boulardii (FLORASTOR) 250 MG capsule Take 250 mg by mouth 2 (two) times daily.  . simvastatin (ZOCOR) 20 MG tablet Take 20 mg by mouth every evening.  . vitamin B-12 (CYANOCOBALAMIN) 1000 MCG tablet Take 1,000 mcg by mouth daily.   No facility-administered encounter medications on file as of 05/05/2019.    PHYSICAL EXAM:   Slender, very sweet elderly female in NAD. Chatty, smiling, and affable.  Daughter Maralyn Sago and D-I-L French Ana participating in ZOOM call.  PE deferred d/t nature of telehealth visit.  Anselm Lis, NP

## 2020-01-24 ENCOUNTER — Telehealth: Payer: Self-pay

## 2020-01-24 NOTE — Telephone Encounter (Signed)
Volunteer called patient on behalf of Palliative Care and did not get a answer from patient/family. ° °

## 2020-03-04 ENCOUNTER — Other Ambulatory Visit: Payer: Self-pay | Admitting: Physical Medicine and Rehabilitation

## 2020-03-04 DIAGNOSIS — M4807 Spinal stenosis, lumbosacral region: Secondary | ICD-10-CM

## 2020-03-14 ENCOUNTER — Other Ambulatory Visit: Payer: Self-pay | Admitting: Physical Medicine and Rehabilitation

## 2020-03-14 DIAGNOSIS — M4807 Spinal stenosis, lumbosacral region: Secondary | ICD-10-CM

## 2020-03-28 ENCOUNTER — Ambulatory Visit
Admission: RE | Admit: 2020-03-28 | Discharge: 2020-03-28 | Disposition: A | Discharge status: Home / Self Care | Payer: Medicare Other | Source: Ambulatory Visit | Attending: Physical Medicine and Rehabilitation | Admitting: Physical Medicine and Rehabilitation

## 2020-03-28 ENCOUNTER — Other Ambulatory Visit: Payer: Self-pay

## 2020-03-28 DIAGNOSIS — M4807 Spinal stenosis, lumbosacral region: Secondary | ICD-10-CM

## 2020-04-26 ENCOUNTER — Telehealth: Payer: Self-pay

## 2020-04-26 NOTE — Telephone Encounter (Signed)
Volunteer with Palliative Care called to check in x 2- no answer

## 2021-12-01 ENCOUNTER — Ambulatory Visit: Admission: EM | Admit: 2021-12-01 | Discharge: 2021-12-01 | Payer: Medicare Other

## 2021-12-01 DIAGNOSIS — R55 Syncope and collapse: Secondary | ICD-10-CM

## 2021-12-01 HISTORY — DX: Type 2 diabetes mellitus without complications: E11.9

## 2021-12-01 HISTORY — DX: Unspecified atrial fibrillation: I48.91

## 2021-12-01 NOTE — ED Notes (Signed)
Unable to reconcile med list-DIL does not know pt's meds

## 2021-12-01 NOTE — ED Provider Notes (Signed)
[facility]  Note:  This document was prepared using Conservation officer, historic buildings and may include unintentional dictation errors.  MRN: [id number] DOB: 1926/08/07  Subjective:   Grace Owens is a 88 y.o. female presenting for fatigue since this morning.  Patient presents with her daughter-in-law who had a near syncopal event on the toilet.  She explains that she did not pass out and fall forward that she more so passed out and leaned back on the toilet.  She has since felt persistently tired and has been slumping over in her walker.  Denies any chest pain, headache, abdominal pain, dysuria.  This morning they did contact EMS who came to her house and perform an EKG.  Results are as below.  She has extensive history of dementia, CHF, atrial fibrillation, MI.  Per patient does not feel any more confused than her baseline.  No current facility-administered medications for this encounter.  Current Outpatient Medications:    acetaminophen (TYLENOL) 650 MG CR tablet, Take 650 mg by mouth daily., Disp: , Rfl:    aspirin 81 MG chewable tablet, Chew 81 mg by mouth daily., Disp: , Rfl:    benazepril (LOTENSIN) 20 MG tablet, Take 20 mg by mouth daily., Disp: , Rfl:    bisoprolol (ZEBETA) 10 MG tablet, Take 1 tablet (10 mg total) by mouth daily., Disp: 30 tablet, Rfl: 11   Calcium Carbonate-Vitamin D (CALCIUM-VITAMIN D3 PO), Take 1 tablet by mouth every evening., Disp: , Rfl:    Coenzyme Q10 (COQ10) 100 MG CAPS, Take 1 tablet by mouth daily., Disp: , Rfl:    diclofenac sodium (VOLTAREN) 1 % GEL, Apply 2 g topically daily as needed (apply to left knee for pain)., Disp: , Rfl:    eplerenone (INSPRA) 25 MG tablet, Take 1 tablet (25 mg total) by mouth every evening., Disp:  , Rfl:    gabapentin (NEURONTIN) 300 MG capsule, Take 300 mg by mouth 3 (three) times daily., Disp: , Rfl:    Glucosamine-Chondroitin 250-200 MG TABS, Take 1 tablet by mouth daily., Disp: , Rfl:     levothyroxine (SYNTHROID) 75 MCG tablet, Take 75 mcg by mouth at bedtime., Disp: , Rfl:    LUTEIN PO, Take 1 tablet by mouth daily., Disp: , Rfl:    mirabegron ER (MYRBETRIQ) 25 MG TB24 tablet, Take 25 mg by mouth daily., Disp: , Rfl:    Multiple Vitamin (MULTIVITAMIN WITH MINERALS) TABS tablet, Take 1 tablet by mouth daily., Disp: , Rfl:    Omega-3 Fatty Acids (FISH OIL) 1200 MG CAPS, Take 1 capsule by mouth 2 (two) times daily., Disp: , Rfl:    OVER THE COUNTER MEDICATION, Take 1 tablet by mouth every evening. Medication: Cinnamon Bark, Disp: , Rfl:    Polyethyl Glycol-Propyl Glycol (SYSTANE FREE OP), Place 1 drop into both eyes 2 (two) times daily., Disp: , Rfl:    Psyllium (METAMUCIL PO), Take 1 tablet by mouth daily., Disp: , Rfl:    saccharomyces boulardii (FLORASTOR) 250 MG capsule, Take 250 mg by mouth 2 (two) times daily., Disp: , Rfl:    simvastatin (ZOCOR) 20 MG tablet, Take 20 mg by mouth every evening., Disp: , Rfl:    vitamin B-12 (CYANOCOBALAMIN) 1000 MCG tablet, Take 1,000 mcg by mouth daily., Disp: , Rfl:    Allergies  Allergen Reactions   Celebrex [Celecoxib]     Past Medical History:  Diagnosis Date   A-fib (HCC)    CHF (congestive heart failure) (HCC)  Dementia (HCC)    Diabetes mellitus without complication (HCC)    Hypertension    Hypothyroidism    Myocardial infarct (HCC)    Pinched nerve      Past Surgical History:  Procedure Laterality Date   APPENDECTOMY     BACK SURGERY     THYROIDECTOMY, PARTIAL      Family History  Problem Relation Age of Onset   Dementia Neg Hx     Social History   Tobacco Use   Smoking status: Never   Smokeless tobacco: Never  Vaping Use   Vaping Use: Never used  Substance Use Topics   Alcohol use: Not Currently   Drug use: Never    ROS   Objective:   Vitals: BP (!) 149/73 (BP Location: Left Arm)   Pulse (!) 50   Temp (!) 97.5 F (36.4 C) (Oral)   Resp 18   SpO2 96%   Physical Exam Constitutional:       General: She is not in acute distress.    Appearance: Normal appearance. She is well-developed. She is not ill-appearing, toxic-appearing or diaphoretic.  HENT:     Head: Normocephalic and atraumatic.     Nose: Nose normal.     Mouth/Throat:     Mouth: Mucous membranes are moist.  Eyes:     General: No scleral icterus.       Right eye: No discharge.        Left eye: No discharge.     Extraocular Movements: Extraocular movements intact.  Cardiovascular:     Rate and Rhythm: Normal rate and regular rhythm.     Heart sounds: Normal heart sounds. No murmur heard.    No friction rub. No gallop.  Pulmonary:     Effort: Pulmonary effort is normal. No respiratory distress.     Breath sounds: No stridor. No wheezing, rhonchi or rales.  Chest:     Chest wall: No tenderness.  Skin:    General: Skin is warm and dry.  Neurological:     General: No focal deficit present.     Mental Status: She is alert and oriented to person, place, and time.  Psychiatric:        Mood and Affect: Mood normal.        Behavior: Behavior normal.     EKG from EMS shows sinus bradycardia at 54bpm, left axis deviation, left bundle branch block.   Assessment and Plan :   PDMP not reviewed this encounter.  1. Near syncope     I placed orders to have an EKG done in our clinic as well as an urinalysis.  Patient and daughter-in-law refused after having a discussion with the patient's daughter who is her power of attorney.  The request was to have complete blood work done.  I advised that the EKG and urinalysis were appropriate for the urgent care setting.  Since they have more concerns involving her blood work I recommended that they attempt to pursue this through their PCP or the emergency room.  As it is it is difficult for me to establish whether or not the patient actually had a syncopal event and I am unable to determine if this was related to her atrial fibrillation or an urinary tract infection given the  refusal to proceed with those tests.  Therefore I do believe it is more appropriate for the patient to be evaluated through the emergency room.   Wallis Bamberg, PA-C 12/01/21 1704

## 2021-12-01 NOTE — ED Triage Notes (Signed)
Per daughter in law pt had near syncopal episode while on the toilet after BM ~11am-states EMS came to the home-was advised to have pt seen-states pt "has been slumping and is more tired"-to triage in own-states pt does not walk w/o a walker-pt with hx of alzheimers-alert

## 2022-04-17 ENCOUNTER — Observation Stay (HOSPITAL_COMMUNITY): Payer: Medicare Other | Admitting: Certified Registered Nurse Anesthetist

## 2022-04-17 ENCOUNTER — Emergency Department (HOSPITAL_COMMUNITY): Payer: Medicare Other

## 2022-04-17 ENCOUNTER — Inpatient Hospital Stay (HOSPITAL_COMMUNITY)
Admission: EM | Admit: 2022-04-17 | Discharge: 2022-04-21 | DRG: 394 | Disposition: A | Discharge status: Home Health | Payer: Medicare Other | Attending: Internal Medicine | Admitting: Internal Medicine

## 2022-04-17 ENCOUNTER — Encounter (HOSPITAL_COMMUNITY)
Admission: EM | Disposition: A | Discharge status: Home Health | Payer: Self-pay | Source: Home / Self Care | Attending: Internal Medicine

## 2022-04-17 ENCOUNTER — Encounter (HOSPITAL_COMMUNITY): Payer: Self-pay

## 2022-04-17 ENCOUNTER — Other Ambulatory Visit: Payer: Self-pay

## 2022-04-17 DIAGNOSIS — I5032 Chronic diastolic (congestive) heart failure: Secondary | ICD-10-CM | POA: Diagnosis present

## 2022-04-17 DIAGNOSIS — Z87440 Personal history of urinary (tract) infections: Secondary | ICD-10-CM

## 2022-04-17 DIAGNOSIS — I509 Heart failure, unspecified: Secondary | ICD-10-CM

## 2022-04-17 DIAGNOSIS — Z79899 Other long term (current) drug therapy: Secondary | ICD-10-CM

## 2022-04-17 DIAGNOSIS — T18128A Food in esophagus causing other injury, initial encounter: Principal | ICD-10-CM | POA: Diagnosis present

## 2022-04-17 DIAGNOSIS — M48061 Spinal stenosis, lumbar region without neurogenic claudication: Secondary | ICD-10-CM | POA: Diagnosis present

## 2022-04-17 DIAGNOSIS — K222 Esophageal obstruction: Secondary | ICD-10-CM | POA: Diagnosis not present

## 2022-04-17 DIAGNOSIS — Z888 Allergy status to other drugs, medicaments and biological substances status: Secondary | ICD-10-CM

## 2022-04-17 DIAGNOSIS — I252 Old myocardial infarction: Secondary | ICD-10-CM

## 2022-04-17 DIAGNOSIS — Z7982 Long term (current) use of aspirin: Secondary | ICD-10-CM

## 2022-04-17 DIAGNOSIS — E119 Type 2 diabetes mellitus without complications: Secondary | ICD-10-CM

## 2022-04-17 DIAGNOSIS — Z66 Do not resuscitate: Secondary | ICD-10-CM | POA: Diagnosis present

## 2022-04-17 DIAGNOSIS — E1122 Type 2 diabetes mellitus with diabetic chronic kidney disease: Secondary | ICD-10-CM | POA: Diagnosis present

## 2022-04-17 DIAGNOSIS — N1832 Chronic kidney disease, stage 3b: Secondary | ICD-10-CM | POA: Diagnosis present

## 2022-04-17 DIAGNOSIS — Z792 Long term (current) use of antibiotics: Secondary | ICD-10-CM

## 2022-04-17 DIAGNOSIS — I480 Paroxysmal atrial fibrillation: Secondary | ICD-10-CM | POA: Diagnosis present

## 2022-04-17 DIAGNOSIS — K449 Diaphragmatic hernia without obstruction or gangrene: Secondary | ICD-10-CM | POA: Diagnosis present

## 2022-04-17 DIAGNOSIS — I447 Left bundle-branch block, unspecified: Secondary | ICD-10-CM | POA: Diagnosis present

## 2022-04-17 DIAGNOSIS — E039 Hypothyroidism, unspecified: Secondary | ICD-10-CM | POA: Diagnosis present

## 2022-04-17 DIAGNOSIS — F0390 Unspecified dementia without behavioral disturbance: Secondary | ICD-10-CM | POA: Diagnosis present

## 2022-04-17 DIAGNOSIS — K2970 Gastritis, unspecified, without bleeding: Secondary | ICD-10-CM | POA: Diagnosis present

## 2022-04-17 DIAGNOSIS — I110 Hypertensive heart disease with heart failure: Secondary | ICD-10-CM

## 2022-04-17 DIAGNOSIS — E785 Hyperlipidemia, unspecified: Secondary | ICD-10-CM | POA: Diagnosis present

## 2022-04-17 DIAGNOSIS — T17908A Unspecified foreign body in respiratory tract, part unspecified causing other injury, initial encounter: Secondary | ICD-10-CM | POA: Diagnosis present

## 2022-04-17 DIAGNOSIS — W44F3XA Food entering into or through a natural orifice, initial encounter: Secondary | ICD-10-CM | POA: Diagnosis present

## 2022-04-17 DIAGNOSIS — D631 Anemia in chronic kidney disease: Secondary | ICD-10-CM | POA: Diagnosis present

## 2022-04-17 DIAGNOSIS — Z79890 Hormone replacement therapy (postmenopausal): Secondary | ICD-10-CM

## 2022-04-17 DIAGNOSIS — E1141 Type 2 diabetes mellitus with diabetic mononeuropathy: Secondary | ICD-10-CM | POA: Diagnosis present

## 2022-04-17 DIAGNOSIS — I130 Hypertensive heart and chronic kidney disease with heart failure and stage 1 through stage 4 chronic kidney disease, or unspecified chronic kidney disease: Secondary | ICD-10-CM | POA: Diagnosis present

## 2022-04-17 HISTORY — PX: FOREIGN BODY REMOVAL: SHX962

## 2022-04-17 HISTORY — PX: ESOPHAGOGASTRODUODENOSCOPY (EGD) WITH PROPOFOL: SHX5813

## 2022-04-17 LAB — CBC WITH DIFFERENTIAL/PLATELET
Abs Immature Granulocytes: 0.01 10*3/uL (ref 0.00–0.07)
Basophils Absolute: 0 10*3/uL (ref 0.0–0.1)
Basophils Relative: 1 %
Eosinophils Absolute: 0.1 10*3/uL (ref 0.0–0.5)
Eosinophils Relative: 1 %
HCT: 41.5 % (ref 36.0–46.0)
Hemoglobin: 13.2 g/dL (ref 12.0–15.0)
Immature Granulocytes: 0 %
Lymphocytes Relative: 23 %
Lymphs Abs: 1.2 10*3/uL (ref 0.7–4.0)
MCH: 32.1 pg (ref 26.0–34.0)
MCHC: 31.8 g/dL (ref 30.0–36.0)
MCV: 101 fL — ABNORMAL HIGH (ref 80.0–100.0)
Monocytes Absolute: 0.5 10*3/uL (ref 0.1–1.0)
Monocytes Relative: 10 %
Neutro Abs: 3.6 10*3/uL (ref 1.7–7.7)
Neutrophils Relative %: 65 %
Platelets: 234 10*3/uL (ref 150–400)
RBC: 4.11 MIL/uL (ref 3.87–5.11)
RDW: 13.5 % (ref 11.5–15.5)
WBC: 5.5 10*3/uL (ref 4.0–10.5)
nRBC: 0 % (ref 0.0–0.2)

## 2022-04-17 LAB — COMPREHENSIVE METABOLIC PANEL
ALT: 19 U/L (ref 0–44)
AST: 33 U/L (ref 15–41)
Albumin: 4.2 g/dL (ref 3.5–5.0)
Alkaline Phosphatase: 51 U/L (ref 38–126)
Anion gap: 10 (ref 5–15)
BUN: 34 mg/dL — ABNORMAL HIGH (ref 8–23)
CO2: 25 mmol/L (ref 22–32)
Calcium: 9.5 mg/dL (ref 8.9–10.3)
Chloride: 102 mmol/L (ref 98–111)
Creatinine, Ser: 1.25 mg/dL — ABNORMAL HIGH (ref 0.44–1.00)
GFR, Estimated: 40 mL/min — ABNORMAL LOW (ref >60)
Glucose, Bld: 110 mg/dL — ABNORMAL HIGH (ref 70–99)
Potassium: 4.6 mmol/L (ref 3.5–5.1)
Sodium: 137 mmol/L (ref 135–145)
Total Bilirubin: 0.8 mg/dL (ref 0.3–1.2)
Total Protein: 7.4 g/dL (ref 6.5–8.1)

## 2022-04-17 LAB — LACTIC ACID, PLASMA: Lactic Acid, Venous: 1.5 mmol/L (ref 0.5–1.9)

## 2022-04-17 LAB — GLUCOSE, CAPILLARY: Glucose-Capillary: 110 mg/dL — ABNORMAL HIGH (ref 70–99)

## 2022-04-17 SURGERY — ESOPHAGOGASTRODUODENOSCOPY (EGD) WITH PROPOFOL
Anesthesia: General

## 2022-04-17 MED ORDER — LACTATED RINGERS IV SOLN: INTRAVENOUS | Status: DC

## 2022-04-17 MED ORDER — LACTATED RINGERS IV SOLN: INTRAVENOUS | Status: AC

## 2022-04-17 MED ORDER — APIXABAN 2.5 MG PO TABS
2.5000 mg | ORAL_TABLET | Freq: Two times a day (BID) | ORAL | Status: DC
  Administered 2022-04-18 – 2022-04-21 (×7): 2.5 mg via ORAL
  Filled 2022-04-17 (×8): qty 1

## 2022-04-17 MED ORDER — PROPOFOL 10 MG/ML IV BOLUS
INTRAVENOUS | Status: DC | PRN
  Administered 2022-04-17: 90 mg via INTRAVENOUS

## 2022-04-17 MED ORDER — GABAPENTIN 100 MG PO CAPS: 100.0000 mg | ORAL_CAPSULE | Freq: Three times a day (TID) | ORAL | Status: DC

## 2022-04-17 MED ORDER — SACCHAROMYCES BOULARDII 250 MG PO CAPS
500.0000 mg | ORAL_CAPSULE | Freq: Two times a day (BID) | ORAL | Status: DC
  Administered 2022-04-18 – 2022-04-20 (×5): 500 mg via ORAL
  Filled 2022-04-17 (×5): qty 2

## 2022-04-17 MED ORDER — PHENYLEPHRINE 80 MCG/ML (10ML) SYRINGE FOR IV PUSH (FOR BLOOD PRESSURE SUPPORT)
PREFILLED_SYRINGE | INTRAVENOUS | Status: DC | PRN
  Administered 2022-04-17: 40 ug via INTRAVENOUS

## 2022-04-17 MED ORDER — VITAMIN B-12 1000 MCG PO TABS
1000.0000 ug | ORAL_TABLET | Freq: Every day | ORAL | Status: DC
  Administered 2022-04-18 – 2022-04-21 (×4): 1000 ug via ORAL
  Filled 2022-04-17 (×5): qty 1

## 2022-04-17 MED ORDER — SIMVASTATIN 20 MG PO TABS
20.0000 mg | ORAL_TABLET | Freq: Every evening | ORAL | Status: DC
  Administered 2022-04-18 – 2022-04-20 (×3): 20 mg via ORAL
  Filled 2022-04-17 (×3): qty 1

## 2022-04-17 MED ORDER — PHENAZOPYRIDINE HCL 200 MG PO TABS
190.0000 mg | ORAL_TABLET | Freq: Every day | ORAL | Status: DC
  Filled 2022-04-17: qty 1

## 2022-04-17 MED ORDER — SODIUM CHLORIDE 0.9 % IV SOLN: INTRAVENOUS | Status: DC

## 2022-04-17 MED ORDER — FENTANYL CITRATE (PF) 100 MCG/2ML IJ SOLN
INTRAMUSCULAR | Status: DC | PRN
  Administered 2022-04-17 (×2): 25 ug via INTRAVENOUS
  Administered 2022-04-17: 50 ug via INTRAVENOUS

## 2022-04-17 MED ORDER — LEVOTHYROXINE SODIUM 75 MCG PO TABS: 75.0000 ug | ORAL_TABLET | Freq: Every day | ORAL | Status: DC

## 2022-04-17 MED ORDER — LIDOCAINE 2% (20 MG/ML) 5 ML SYRINGE
INTRAMUSCULAR | Status: DC | PRN
  Administered 2022-04-17: 50 mg via INTRAVENOUS

## 2022-04-17 MED ORDER — MIRABEGRON ER 25 MG PO TB24: 25.0000 mg | ORAL_TABLET | Freq: Every day | ORAL | Status: DC

## 2022-04-17 MED ORDER — FENTANYL CITRATE (PF) 100 MCG/2ML IJ SOLN
INTRAMUSCULAR | Status: AC
  Filled 2022-04-17: qty 2

## 2022-04-17 MED ORDER — BISOPROLOL FUMARATE 5 MG PO TABS
5.0000 mg | ORAL_TABLET | Freq: Every day | ORAL | Status: DC
  Administered 2022-04-18 – 2022-04-21 (×4): 5 mg via ORAL
  Filled 2022-04-17 (×5): qty 1

## 2022-04-17 MED ORDER — LEVOTHYROXINE SODIUM 100 MCG PO TABS
100.0000 ug | ORAL_TABLET | Freq: Every day | ORAL | Status: DC
  Administered 2022-04-19 – 2022-04-21 (×3): 100 ug via ORAL
  Filled 2022-04-17 (×3): qty 1

## 2022-04-17 MED ORDER — POLYETHYLENE GLYCOL 3350 17 G PO PACK
17.0000 g | PACK | Freq: Every day | ORAL | Status: DC | PRN
  Administered 2022-04-20: 17 g via ORAL
  Filled 2022-04-17: qty 1

## 2022-04-17 MED ORDER — ASPIRIN 81 MG PO CHEW: 81.0000 mg | CHEWABLE_TABLET | Freq: Every day | ORAL | Status: DC

## 2022-04-17 MED ORDER — COQ10 100 MG PO CAPS: 1.0000 | ORAL_CAPSULE | Freq: Every day | ORAL | Status: DC

## 2022-04-17 MED ORDER — PROPOFOL 10 MG/ML IV BOLUS
INTRAVENOUS | Status: AC
  Filled 2022-04-17: qty 20

## 2022-04-17 MED ORDER — CEPHALEXIN 250 MG PO CAPS
250.0000 mg | ORAL_CAPSULE | Freq: Every day | ORAL | Status: DC
  Administered 2022-04-18 – 2022-04-21 (×4): 250 mg via ORAL
  Filled 2022-04-17 (×4): qty 1

## 2022-04-17 MED ORDER — ACETAMINOPHEN 325 MG PO TABS: 650.0000 mg | ORAL_TABLET | Freq: Four times a day (QID) | ORAL | Status: DC | PRN

## 2022-04-17 MED ORDER — ADULT MULTIVITAMIN W/MINERALS CH
1.0000 | ORAL_TABLET | Freq: Every day | ORAL | Status: DC
  Administered 2022-04-18 – 2022-04-20 (×3): 1 via ORAL
  Filled 2022-04-17 (×3): qty 1

## 2022-04-17 MED ORDER — ONDANSETRON HCL 4 MG/2ML IJ SOLN
INTRAMUSCULAR | Status: DC | PRN
  Administered 2022-04-17: 4 mg via INTRAVENOUS

## 2022-04-17 MED ORDER — SUCCINYLCHOLINE CHLORIDE 200 MG/10ML IV SOSY
PREFILLED_SYRINGE | INTRAVENOUS | Status: DC | PRN
  Administered 2022-04-17: 100 mg via INTRAVENOUS

## 2022-04-17 MED ORDER — BENAZEPRIL HCL 20 MG PO TABS
20.0000 mg | ORAL_TABLET | Freq: Two times a day (BID) | ORAL | Status: DC
  Administered 2022-04-18 – 2022-04-21 (×7): 20 mg via ORAL
  Filled 2022-04-17 (×8): qty 1

## 2022-04-17 MED ORDER — HYDRALAZINE HCL 20 MG/ML IJ SOLN
5.0000 mg | Freq: Four times a day (QID) | INTRAMUSCULAR | Status: DC | PRN
  Administered 2022-04-17 – 2022-04-20 (×3): 5 mg via INTRAVENOUS
  Filled 2022-04-17 (×4): qty 1

## 2022-04-17 MED ORDER — ENOXAPARIN SODIUM 40 MG/0.4ML IJ SOSY
40.0000 mg | PREFILLED_SYRINGE | INTRAMUSCULAR | Status: DC
Start: 2022-04-17 — End: 2022-04-17

## 2022-04-17 MED ORDER — PANTOPRAZOLE SODIUM 40 MG IV SOLR
40.0000 mg | Freq: Once | INTRAVENOUS | Status: AC
  Administered 2022-04-17: 40 mg via INTRAVENOUS
  Filled 2022-04-17: qty 10

## 2022-04-17 MED ORDER — SACCHAROMYCES BOULARDII 250 MG PO CAPS: 250.0000 mg | ORAL_CAPSULE | Freq: Two times a day (BID) | ORAL | Status: DC

## 2022-04-17 SURGICAL SUPPLY — 15 items

## 2022-04-17 NOTE — Anesthesia Postprocedure Evaluation (Signed)
Anesthesia Post Note  Patient: Grace Owens  Procedure(s) Performed: ESOPHAGOGASTRODUODENOSCOPY (EGD) WITH PROPOFOL FOREIGN BODY REMOVAL     Patient location during evaluation: PACU Anesthesia Type: General Level of consciousness: awake and alert Pain management: pain level controlled Vital Signs Assessment: post-procedure vital signs reviewed and stable Respiratory status: spontaneous breathing, nonlabored ventilation and respiratory function stable Cardiovascular status: blood pressure returned to baseline and stable Postop Assessment: no apparent nausea or vomiting Anesthetic complications: no   No notable events documented.  Last Vitals:  Vitals:   04/17/22 2215 04/17/22 2230  BP:    Pulse: 61   Resp:    Temp: 36.6 C 36.6 C  SpO2: 100% 96%    Last Pain:  Vitals:   04/17/22 1946  TempSrc: Oral  PainSc: 0-No pain                 [name] A.

## 2022-04-17 NOTE — Anesthesia Preprocedure Evaluation (Addendum)
Anesthesia Evaluation  Patient identified by MRN, date of birth, ID band Patient awake    Reviewed: Allergy & Precautions, NPO status , Patient's Chart, lab work & pertinent test results, reviewed documented beta blocker date and time   Airway Mallampati: II  TM Distance: >3 FB Neck ROM: Full    Dental  (+) Teeth Intact, Caps, Missing, Dental Advisory Given   Pulmonary  Aspiration    Pulmonary exam normal breath sounds clear to auscultation       Cardiovascular hypertension, Pt. on medications + Past MI and +CHF  Normal cardiovascular exam Rhythm:Irregular Rate:Normal  LBBB pattern on monitor   Neuro/Psych  PSYCHIATRIC DISORDERS     Dementia negative neurological ROS     GI/Hepatic Neg liver ROS, hiatal hernia,,,Food impaction   Endo/Other  diabetes, Well Controlled, Type 2Hypothyroidism  Hyperlipidemia  Renal/GU Renal InsufficiencyRenal disease  negative genitourinary   Musculoskeletal negative musculoskeletal ROS (+)    Abdominal   Peds  Hematology negative hematology ROS (+)   Anesthesia Other Findings   Reproductive/Obstetrics                              Anesthesia Physical Anesthesia Plan  ASA: 3 and emergent  Anesthesia Plan: General   Post-op Pain Management: Minimal or no pain anticipated   Induction: Intravenous, Rapid sequence and Cricoid pressure planned  PONV Risk Score and Plan: 4 or greater and Treatment may vary due to age or medical condition  Airway Management Planned: Oral ETT  Additional Equipment: None  Intra-op Plan:   Post-operative Plan: Extubation in OR  Informed Consent: I have reviewed the patients History and Physical, chart, labs and discussed the procedure including the risks, benefits and alternatives for the proposed anesthesia with the patient or authorized representative who has indicated his/her understanding and acceptance.   Patient  has DNR.  Discussed DNR with power of attorney and Suspend DNR.   Dental advisory given  Plan Discussed with: CRNA and Anesthesiologist  Anesthesia Plan Comments:          Anesthesia Quick Evaluation

## 2022-04-17 NOTE — Anesthesia Procedure Notes (Signed)
Procedure Name: Intubation Date/Time: 04/17/2022 8:42 PM  Performed by: Epimenio Sarin, CRNAPre-anesthesia Checklist: Patient identified, Emergency Drugs available, Suction available, Patient being monitored and Timeout performed Patient Re-evaluated:Patient Re-evaluated prior to induction Oxygen Delivery Method: Circle system utilized Preoxygenation: Pre-oxygenation with 100% oxygen Induction Type: IV induction, Rapid sequence and Cricoid Pressure applied Laryngoscope Size: Mac and 3 Grade View: Grade I Tube type: Oral Tube size: 7.0 mm Number of attempts: 1 Airway Equipment and Method: Stylet Placement Confirmation: ETT inserted through vocal cords under direct vision, positive ETCO2 and breath sounds checked- equal and bilateral Secured at: 21 cm Tube secured with: Tape Dental Injury: Teeth and Oropharynx as per pre-operative assessment

## 2022-04-17 NOTE — ED Triage Notes (Signed)
Patient coming from home via EMS with c/o possible aspiration on own secretions. Per family member, pt has hx of same but states that this episode lasted longer that previous ones have. Per EMS rales heard in lower lobes per daughter hx of same. Pt hx of dementia. At baseline per family. Pt currently able to clear own secretions.

## 2022-04-17 NOTE — Transfer of Care (Signed)
Immediate Anesthesia Transfer of Care Note  Patient: Grace Owens  Procedure(s) Performed: ESOPHAGOGASTRODUODENOSCOPY (EGD) WITH PROPOFOL FOREIGN BODY REMOVAL  Patient Location: PACU  Anesthesia Type:General  Level of Consciousness: drowsy and patient cooperative  Airway & Oxygen Therapy: Patient Spontanous Breathing and Patient connected to face mask oxygen  Post-op Assessment: Report given to RN and Post -op Vital signs reviewed and stable  Post vital signs: Reviewed and stable  Last Vitals:  Vitals Value Taken Time  BP 181/87 04/17/22 2200  Temp    Pulse    Resp 24 04/17/22 2200  SpO2    Vitals shown include unvalidated device data.  Last Pain:  Vitals:   04/17/22 1946  TempSrc: Oral  PainSc: 0-No pain         Complications: No notable events documented.

## 2022-04-17 NOTE — Progress Notes (Signed)
ANTICOAGULATION CONSULT NOTE - Initial Consult  Pharmacy Consult for Eliquis Indication: atrial fibrillation  Allergies  Allergen Reactions   Celebrex [Celecoxib]     Patient Measurements: Height: 5\' 6"  (167.6 cm) Weight: 63.5 kg (140 lb) IBW/kg (Calculated) : 59.3  Vital Signs: Temp: 98.6 F (37 C) (04/05 1946) Temp Source: Oral (04/05 1946) BP: 221/94 (04/05 1946) Pulse Rate: 60 (04/05 1946)  Labs: Recent Labs    04/17/22 1902  HGB 13.2  HCT 41.5  PLT 234  CREATININE 1.25*    Estimated Creatinine Clearance: 26.3 mL/min (A) (by C-G formula based on SCr of 1.25 mg/dL (H)).   Medical History: Past Medical History:  Diagnosis Date   A-fib    CHF (congestive heart failure)    Dementia    Diabetes mellitus without complication    Hypertension    Hypothyroidism    Myocardial infarct    Pinched nerve     Assessment: Food impaction>>urgent EGD  AC/Heme: h/o afib on Eliquis PTA ow dose . Baseline Hgb 13.2. Plts WNL.- 89 y/o, 63kg, Scr 1.25  Goal of Therapy:  Therapeutic oral anticoagulation   Plan:  D/c Lovenox order Resume Eliquis 2.5mg  BID   Itzayanna Kaster S. Merilynn Finland, PharmD, BCPS Clinical Staff Pharmacist [email or web address] Merilynn Finland, Chaniyah Jahr Stillinger 04/17/2022,8:42 PM

## 2022-04-17 NOTE — Op Note (Signed)
[facility] Patient Name: Grace Owens Procedure Date: 04/17/2022 MRN: [id number] Attending MD: Shirley FriarVincent C Alexxis Mackert , MD, [id number] Date of Birth: 06/12/1926 CSN: [id number] Age: 89 Admit Type: Emergency Department Procedure:                Upper GI endoscopy Indications:              Foreign body in the esophagus, Hiatal hernia Providers:                Shirley FriarVincent C. Cherryl Babin, MD, Roselie AwkwardShannon Love, RN, Beryle BeamsJanie                            Billups, Technician, Milinda CaveJoshua Solheim, CRNA Referring MD:             hospital team Medicines:                General Anesthesia, See the Anesthesia note for                            documentation of the administered medications Complications:            No immediate complications. Estimated Blood Loss:     Estimated blood loss was minimal. Procedure:                Pre-Anesthesia Assessment:                           - Prior to the procedure, a History and Physical                            was performed, and patient medications and                            allergies were reviewed. The patient's tolerance of                            previous anesthesia was also reviewed. The risks                            and benefits of the procedure and the sedation                            options and risks were discussed with the patient.                            All questions were answered, and informed consent                            was obtained. Prior Anticoagulants: The patient has                            taken Eliquis (apixaban). ASA Grade Assessment: III                            - A patient with severe systemic disease. After  reviewing the risks and benefits, the patient was                            deemed in satisfactory condition to undergo the                            procedure.                           After obtaining informed consent, the endoscope was                            passed under  direct vision. Throughout the                            procedure, the patient's blood pressure, pulse, and                            oxygen saturations were monitored continuously. The                            [id number]-[id number] ([id number]) Olympus endoscope was introduced                            through the mouth, and advanced to the second part                            of duodenum. The upper GI endoscopy was performed                            with difficulty due to presence of food. Successful                            completion of the procedure was aided by performing                            the maneuvers documented (below) in this report.                            The patient tolerated the procedure well. Scope In: Scope Out: Findings:      Food was found in the entire esophagus. Removal was accomplished with       multiple passes of a Roth net. Estimated blood loss was minimal.      The lumen of the distal esophagus was moderately dilated.      The distal esophagus was moderately tortuous.      The Z-line was regular and was found 36 cm from the incisors.      Segmental mild inflammation characterized by congestion (edema) was       found in the gastric antrum.      The examined duodenum was normal.      A medium-sized hiatal hernia was present.      One benign-appearing, intrinsic moderate (circumferential scarring or       stenosis; an endoscope may pass) stenosis was found in the mid  esophagus. The stenosis was traversed. Impression:               - Food in the esophagus. Removal was successful.                           - Dilation in the distal esophagus.                           - Tortuous esophagus.                           - Z-line regular, 36 cm from the incisors.                           - Gastritis.                           - Normal examined duodenum.                           - Medium-sized hiatal hernia.                           - Benign-appearing  esophageal stenosis. Moderate Sedation:      Not Applicable - Patient had care per Anesthesia. Recommendation:           - NPO.                           - Observe patient's clinical course.                           - Use Protonix (pantoprazole) 40 mg IV daily. Procedure Code(s):        --- Professional ---                           [phone], Esophagogastroduodenoscopy, flexible,                            transoral; with removal of foreign body(s) Diagnosis Code(s):        --- Professional ---                           A21.308M, Food in esophagus causing other injury,                            initial encounter                           T18.108A, Unspecified foreign body in esophagus                            causing other injury, initial encounter                           K22.2, Esophageal obstruction  K29.70, Gastritis, unspecified, without bleeding                           Q39.9, Congenital malformation of esophagus,                            unspecified                           K44.9, Diaphragmatic hernia without obstruction or                            gangrene                           K22.89, Other specified disease of esophagus CPT copyright 2022 [employer]. All rights reserved. The codes documented in this report are preliminary and upon coder review may  be revised to meet current compliance requirements. Shirley FriarVincent C Bush Murdoch, MD 04/17/2022 10:04:41 PM This report has been signed electronically. Number of Addenda: 0

## 2022-04-17 NOTE — H&P (Incomplete)
History and Physical  Grace RamRosemary Shadowens ZOX:[id number] DOB: 03/27/1926 DOA: 04/17/2022  Referring physician: Dr. Donnald GarrePfeiffer, EDP  PCP: Ezequiel KayserBright, David K, MD  Outpatient Specialists: GI Patient coming from: Home  Chief Complaint: Aspiration at home.  HPI: Grace Owens is a 89 y.o. female with medical history significant for dementia, paroxysmal A-fib on Eliquis, chronic diastolic CHF, hyperlipidemia, hypothyroidism, hypertension, polyneuropathy, CKD 3B, history of urinary tract infection on prophylactic Keflex and on daily probiotics, hiatal hernia, who [facility] with concern for recurrent aspiration.  During her lunch she choked up on her meal per her family account.  She has had a similar episode in the past but this episode lasted longer.  Patient's family brought her into the ED for further evaluation.  EDP discussed the case with GI on-call, Dr. Bosie ClosSchooler, plan for EGD tonight.  [facility], hospitalist service, was asked to admit for observation.  The patient was seen and examined at her bedside after her EGD.  She is pleasantly confused.  She has no complaints.  ED Course: Tmax 98.7.  BP 221/94, pulse 60, respiratory 17.  O2 saturation 96% on room air.  Review of Systems: Review of systems as noted in the HPI. All other systems reviewed and are negative.   Past Medical History:  Diagnosis Date   A-fib    CHF (congestive heart failure)    Dementia    Diabetes mellitus without complication    Hypertension    Hypothyroidism    Myocardial infarct    Pinched nerve    Past Surgical History:  Procedure Laterality Date   APPENDECTOMY     BACK SURGERY     THYROIDECTOMY, PARTIAL      Social History:  reports that she has never smoked. She has never used smokeless tobacco. She reports that she does not currently use alcohol. She reports that she does not use drugs.   Allergies  Allergen Reactions   Celebrex [Celecoxib]     Family History  Problem Relation Age of Onset    Dementia Neg Hx       Prior to Admission medications   Medication Sig Start Date End Date Taking? Authorizing Provider  acetaminophen (TYLENOL) 650 MG CR tablet Take 650 mg by mouth daily.    [provider]  aspirin 81 MG chewable tablet Chew 81 mg by mouth daily.    [provider]  benazepril (LOTENSIN) 20 MG tablet Take 20 mg by mouth daily.    [provider]  bisoprolol (ZEBETA) 10 MG tablet Take 1 tablet (10 mg total) by mouth daily. 10/20/18 10/20/19  Marinda ElkFeliz Ortiz, Abraham, MD  Calcium Carbonate-Vitamin D (CALCIUM-VITAMIN D3 PO) Take 1 tablet by mouth every evening.    [provider]  Coenzyme Q10 (COQ10) 100 MG CAPS Take 1 tablet by mouth daily.    [provider]  diclofenac sodium (VOLTAREN) 1 % GEL Apply 2 g topically daily as needed (apply to left knee for pain).    [provider]  eplerenone (INSPRA) 25 MG tablet Take 1 tablet (25 mg total) by mouth every evening. 10/22/18   Marinda ElkFeliz Ortiz, Abraham, MD  gabapentin (NEURONTIN) 300 MG capsule Take 300 mg by mouth 3 (three) times daily.    [provider]  Glucosamine-Chondroitin 250-200 MG TABS Take 1 tablet by mouth daily.    [provider]  levothyroxine (SYNTHROID) 75 MCG tablet Take 75 mcg by mouth at bedtime.    [provider]  LUTEIN PO Take 1 tablet  by mouth daily.    [provider]  mirabegron ER (MYRBETRIQ) 25 MG TB24 tablet Take 25 mg by mouth daily.    [provider]  Multiple Vitamin (MULTIVITAMIN WITH MINERALS) TABS tablet Take 1 tablet by mouth daily.    [provider]  Omega-3 Fatty Acids (FISH OIL) 1200 MG CAPS Take 1 capsule by mouth 2 (two) times daily.    [provider]  OVER THE COUNTER MEDICATION Take 1 tablet by mouth every evening. Medication: Cinnamon Database administratorBark    [provider]  Polyethyl Glycol-Propyl Glycol (SYSTANE FREE OP) Place 1 drop into both eyes 2 (two) times daily.     [provider]  Psyllium (METAMUCIL PO) Take 1 tablet by mouth daily.    [provider]  saccharomyces boulardii (FLORASTOR) 250 MG capsule Take 250 mg by mouth 2 (two) times daily.    [provider]  simvastatin (ZOCOR) 20 MG tablet Take 20 mg by mouth every evening.    [provider]  vitamin B-12 (CYANOCOBALAMIN) 1000 MCG tablet Take 1,000 mcg by mouth daily.    [provider]    Physical Exam: BP (!) 221/94   Pulse 60   Temp 98.6 F (37 C) (Oral)   Resp 17   LMP  (LMP Unknown)   SpO2 96%   General: 89 y.o. year-old female well developed well nourished in no acute distress.  Alert, and pleasantly confused. Cardiovascular: Regular rate and rhythm with no rubs or gallops.  No thyromegaly or JVD noted.  No lower extremity edema. 2/4 pulses in all 4 extremities. Respiratory: Clear to auscultation with no wheezes or rales.  Poor inspiratory effort. Abdomen: Soft nontender nondistended with normal bowel sounds x4 quadrants. Muskuloskeletal: No cyanosis, clubbing or edema noted bilaterally Neuro: CN II-XII intact, strength, sensation, reflexes Skin: No ulcerative lesions noted or rashes Psychiatry: Mood is appropriate for condition and setting          Labs on Admission:  Basic Metabolic Panel: No results for input(s): "NA", "K", "CL", "CO2", "GLUCOSE", "BUN", "CREATININE", "CALCIUM", "MG", "PHOS" in the last 168 hours. Liver Function Tests: No results for input(s): "AST", "ALT", "ALKPHOS", "BILITOT", "PROT", "ALBUMIN" in the last 168 hours. No results for input(s): "LIPASE", "AMYLASE" in the last 168 hours. No results for input(s): "AMMONIA" in the last 168 hours. CBC: Recent Labs  Lab 04/17/22 1902  WBC 5.5  NEUTROABS 3.6  HGB 13.2  HCT 41.5  MCV 101.0*  PLT 234   Cardiac Enzymes: No results for input(s): "CKTOTAL", "CKMB", "CKMBINDEX", "TROPONINI" in the last 168 hours.  BNP (last 3 results) No results for input(s):  "BNP" in the last 8760 hours.  ProBNP (last 3 results) No results for input(s): "PROBNP" in the last 8760 hours.  CBG: No results for input(s): "GLUCAP" in the last 168 hours.  Radiological Exams on Admission: DG Chest 2 View  Result Date: 04/17/2022 CLINICAL DATA:  Possible aspiration EXAM: CHEST - 2 VIEW COMPARISON:  10/14/2018 FINDINGS: Moderate-sized hiatal hernia. Heart and mediastinal contours are within normal limits. No focal opacities or effusions. No acute bony abnormality. IMPRESSION: Hiatal hernia. No active cardiopulmonary disease. Electronically Signed   By: Charlett NoseKevin  Dover M.D.   On: 04/17/2022 17:05    EKG: I independently viewed the EKG done and my findings are as followed: None available at the time of this visit.  Assessment/Plan Present on Admission:  Aspiration into airway  Principal Problem:   Aspiration into airway  Aspiration into airway,  rule out food impaction, esophageal stricture. Chest x-ray nonacute, hiatal hernia seen on chest x-ray. Seen by GI, plan for EGD tonight 04/17/22. Strict N.p.o. until cleared by GI and passes a swallow evaluation by speech therapist Aspiration precaution in place. Plan per GI.  Hypothyroidism Resume home levothyroxine when cleared by GI  Recurrent UTI, on prophylactic Keflex Resume home Keflex  Hypertension Resume home oral antihypertensives once cleared by GI IV hydralazine as needed with parameters  Hyperlipidemia Resume home regimen once no longer n.p.o.  Chronic lumbar stenosis/polyneuropathy Resume home gabapentin when no longer n.p.o.  Dementia Reorient as needed  CKD 3B At baseline Avoid nephrotoxic agents, dehydration and hypotension Gentle IV fluid hydration LR 50 cc/h x1 day   DVT prophylaxis: Subcu Lovenox daily  Code Status: DNR per the patient's daughter via phone.  Family Communication: Updated the patient's daughter Maralyn Sago via phone.  Disposition Plan: Admitted to telemetry unit  Consults  called: GI  Admission status: Observation status.   Status is: Observation    Darlin Drop MD [facility] Pager [phone]  If 7PM-7AM, please contact night-coverage [email or web address] Password [facility]  04/17/2022, 7:53 PM

## 2022-04-17 NOTE — Interval H&P Note (Signed)
History and Physical Interval Note:  04/17/2022 8:06 PM  Grace Owens  has presented today for surgery, with the diagnosis of Food Impaction.  The various methods of treatment have been discussed with the patient and family. After consideration of risks, benefits and other options for treatment, the patient has consented to  Procedure(s): ESOPHAGOGASTRODUODENOSCOPY (EGD) WITH PROPOFOL (N/A) as a surgical intervention.  The patient's history has been reviewed, patient examined, no change in status, stable for surgery.  I have reviewed the patient's chart and labs.  Questions were answered to the patient's satisfaction.     Shirley Friar

## 2022-04-17 NOTE — Consult Note (Signed)
Referring Provider: Dr. Donnald Garre Primary Care Physician:  Ezequiel Kayser, MD Primary Gastroenterologist:  Gentry Fitz  Reason for Consultation:  Food Impaction  HPI: Grace Owens is a 89 y.o. female with history of a hiatal hernia and medical problems as stated below who has rare episodes of choking or regurgitating at times over the years but today while eating an enchilada for lunch (halfway into eating it) she started gurgling and coughing and spitting out foamy material and foamy secretions and has been unable to tolerate a water challenge in the ER. Seems to be keeping her saliva down. No known history of esophageal strictures or EGDs in the past. Patient has dementia and history obtained by her daughter-in-law, French Ana at bedside and her daughter, Maralyn Sago by phone.  Past Medical History:  Diagnosis Date   A-fib    CHF (congestive heart failure)    Dementia    Diabetes mellitus without complication    Hypertension    Hypothyroidism    Myocardial infarct    Pinched nerve     Past Surgical History:  Procedure Laterality Date   APPENDECTOMY     BACK SURGERY     THYROIDECTOMY, PARTIAL      Prior to Admission medications   Medication Sig Start Date End Date Taking? Authorizing Provider  acetaminophen (TYLENOL) 650 MG CR tablet Take 650 mg by mouth daily.    [provider]  aspirin 81 MG chewable tablet Chew 81 mg by mouth daily.    [provider]  benazepril (LOTENSIN) 20 MG tablet Take 20 mg by mouth daily.    [provider]  bisoprolol (ZEBETA) 10 MG tablet Take 1 tablet (10 mg total) by mouth daily. 10/20/18 10/20/19  Marinda Elk, MD  Calcium Carbonate-Vitamin D (CALCIUM-VITAMIN D3 PO) Take 1 tablet by mouth every evening.    [provider]  Coenzyme Q10 (COQ10) 100 MG CAPS Take 1 tablet by mouth daily.    [provider]  diclofenac sodium (VOLTAREN) 1 % GEL Apply 2 g topically daily as needed (apply to left knee for  pain).    [provider]  eplerenone (INSPRA) 25 MG tablet Take 1 tablet (25 mg total) by mouth every evening. 10/22/18   Marinda Elk, MD  gabapentin (NEURONTIN) 300 MG capsule Take 300 mg by mouth 3 (three) times daily.    [provider]  Glucosamine-Chondroitin 250-200 MG TABS Take 1 tablet by mouth daily.    [provider]  levothyroxine (SYNTHROID) 75 MCG tablet Take 75 mcg by mouth at bedtime.    [provider]  LUTEIN PO Take 1 tablet by mouth daily.    [provider]  mirabegron ER (MYRBETRIQ) 25 MG TB24 tablet Take 25 mg by mouth daily.    [provider]  Multiple Vitamin (MULTIVITAMIN WITH MINERALS) TABS tablet Take 1 tablet by mouth daily.    [provider]  Omega-3 Fatty Acids (FISH OIL) 1200 MG CAPS Take 1 capsule by mouth 2 (two) times daily.    [provider]  OVER THE COUNTER MEDICATION Take 1 tablet by mouth every evening. Medication: Cinnamon Database administrator, Historical, MD  Polyethyl Glycol-Propyl Glycol (SYSTANE FREE OP) Place 1 drop into both eyes 2 (two) times daily.    [provider]  Psyllium (METAMUCIL PO) Take 1 tablet by mouth daily.    [provider]  saccharomyces boulardii (FLORASTOR) 250 MG capsule Take 250 mg by mouth 2 (two) times daily.  [provider]  simvastatin (ZOCOR) 20 MG tablet Take 20 mg by mouth every evening.    [provider]  vitamin B-12 (CYANOCOBALAMIN) 1000 MCG tablet Take 1,000 mcg by mouth daily.    [provider]    Scheduled Meds: Continuous Infusions:  sodium chloride     lactated ringers 125 mL/hr at 04/17/22 1931   PRN Meds:.  Allergies as of 04/17/2022 - Review Complete 04/17/2022  Allergen Reaction Noted   Celebrex [celecoxib]  12/01/2021    Family History  Problem Relation Age of Onset   Dementia Neg Hx     Social History   Socioeconomic History   Marital status: Widowed    Spouse  name: Not on file   Number of children: Not on file   Years of education: Not on file   Highest education level: Not on file  Occupational History   Not on file  Tobacco Use   Smoking status: Never   Smokeless tobacco: Never  Vaping Use   Vaping Use: Never used  Substance and Sexual Activity   Alcohol use: Not Currently   Drug use: Never   Sexual activity: Not on file  Other Topics Concern   Not on file  Social History Narrative   Not on file   Social Determinants of Health   Financial Resource Strain: Not on file  Food Insecurity: Not on file  Transportation Needs: Not on file  Physical Activity: Not on file  Stress: Not on file  Social Connections: Not on file  Intimate Partner Violence: Not on file    Review of Systems: All negative except as stated above in HPI.  Physical Exam: Vital signs: Vitals:   04/17/22 1830 04/17/22 1946  BP: (!) 186/88 (!) 221/94  Pulse: (!) 57 60  Resp: 18 17  Temp:  98.6 F (37 C)  SpO2: 97% 96%     General:   Elderly, demented, thin, pleasant, no acute distress  Head: normocephalic, atraumatic Eyes: anicteric sclera ENT: oropharynx clear Neck: supple, nontender Lungs:  Clear throughout to auscultation.   No wheezes, crackles, or rhonchi. No acute distress. Heart:  Regular rate and rhythm; no murmurs, clicks, rubs,  or gallops. Abdomen: soft, nontender, nondistended, +BS  Rectal:  Deferred Ext: no edema  GI:  Lab Results: Recent Labs    04/17/22 1902  WBC 5.5  HGB 13.2  HCT 41.5  PLT 234   BMET No results for input(s): "NA", "K", "CL", "CO2", "GLUCOSE", "BUN", "CREATININE", "CALCIUM" in the last 72 hours. LFT No results for input(s): "PROT", "ALBUMIN", "AST", "ALT", "ALKPHOS", "BILITOT", "BILIDIR", "IBILI" in the last 72 hours. PT/INR No results for input(s): "LABPROT", "INR" in the last 72 hours.   Studies/Results: DG Chest 2 View  Result Date: 04/17/2022 CLINICAL DATA:  Possible aspiration EXAM: CHEST - 2  VIEW COMPARISON:  10/14/2018 FINDINGS: Moderate-sized hiatal hernia. Heart and mediastinal contours are within normal limits. No focal opacities or effusions. No acute bony abnormality. IMPRESSION: Hiatal hernia. No active cardiopulmonary disease. Electronically Signed   By: Charlett NoseKevin  Dover M.D.   On: 04/17/2022 17:05    Impression/Plan: 89 yo with dementia and hiatal hernia presenting with a food impaction in need of an urgent EGD. Will be admitted for IVFs and may need a speech path evaluation to determine what consistency of food is safe for her to eat. Supportive care.    LOS: 0 days   Shirley FriarVincent C Trichelle Lehan  04/17/2022, 7:58 PM  Questions please call [phone]

## 2022-04-17 NOTE — H&P (View-Only) (Signed)
Referring Provider: Dr. Donnald Garre Primary Care Physician:  Ezequiel Kayser, MD Primary Gastroenterologist:  Gentry Fitz  Reason for Consultation:  Food Impaction  HPI: Grace Owens is a 89 y.o. female with history of a hiatal hernia and medical problems as stated below who has rare episodes of choking or regurgitating at times over the years but today while eating an enchilada for lunch (halfway into eating it) she started gurgling and coughing and spitting out foamy material and foamy secretions and has been unable to tolerate a water challenge in the ER. Seems to be keeping her saliva down. No known history of esophageal strictures or EGDs in the past. Patient has dementia and history obtained by her daughter-in-law, French Ana at bedside and her daughter, Maralyn Sago by phone.  Past Medical History:  Diagnosis Date   A-fib    CHF (congestive heart failure)    Dementia    Diabetes mellitus without complication    Hypertension    Hypothyroidism    Myocardial infarct    Pinched nerve     Past Surgical History:  Procedure Laterality Date   APPENDECTOMY     BACK SURGERY     THYROIDECTOMY, PARTIAL      Prior to Admission medications   Medication Sig Start Date End Date Taking? Authorizing Provider  acetaminophen (TYLENOL) 650 MG CR tablet Take 650 mg by mouth daily.    [provider]  aspirin 81 MG chewable tablet Chew 81 mg by mouth daily.    [provider]  benazepril (LOTENSIN) 20 MG tablet Take 20 mg by mouth daily.    [provider]  bisoprolol (ZEBETA) 10 MG tablet Take 1 tablet (10 mg total) by mouth daily. 10/20/18 10/20/19  Marinda Elk, MD  Calcium Carbonate-Vitamin D (CALCIUM-VITAMIN D3 PO) Take 1 tablet by mouth every evening.    [provider]  Coenzyme Q10 (COQ10) 100 MG CAPS Take 1 tablet by mouth daily.    [provider]  diclofenac sodium (VOLTAREN) 1 % GEL Apply 2 g topically daily as needed (apply to left knee for  pain).    [provider]  eplerenone (INSPRA) 25 MG tablet Take 1 tablet (25 mg total) by mouth every evening. 10/22/18   Marinda Elk, MD  gabapentin (NEURONTIN) 300 MG capsule Take 300 mg by mouth 3 (three) times daily.    [provider]  Glucosamine-Chondroitin 250-200 MG TABS Take 1 tablet by mouth daily.    [provider]  levothyroxine (SYNTHROID) 75 MCG tablet Take 75 mcg by mouth at bedtime.    [provider]  LUTEIN PO Take 1 tablet by mouth daily.    [provider]  mirabegron ER (MYRBETRIQ) 25 MG TB24 tablet Take 25 mg by mouth daily.    [provider]  Multiple Vitamin (MULTIVITAMIN WITH MINERALS) TABS tablet Take 1 tablet by mouth daily.    [provider]  Omega-3 Fatty Acids (FISH OIL) 1200 MG CAPS Take 1 capsule by mouth 2 (two) times daily.    [provider]  OVER THE COUNTER MEDICATION Take 1 tablet by mouth every evening. Medication: Cinnamon Database administrator, Historical, MD  Polyethyl Glycol-Propyl Glycol (SYSTANE FREE OP) Place 1 drop into both eyes 2 (two) times daily.    [provider]  Psyllium (METAMUCIL PO) Take 1 tablet by mouth daily.    [provider]  saccharomyces boulardii (FLORASTOR) 250 MG capsule Take 250 mg by mouth 2 (two) times daily.  [provider]  simvastatin (ZOCOR) 20 MG tablet Take 20 mg by mouth every evening.    [provider]  vitamin B-12 (CYANOCOBALAMIN) 1000 MCG tablet Take 1,000 mcg by mouth daily.    [provider]    Scheduled Meds: Continuous Infusions:  sodium chloride     lactated ringers 125 mL/hr at 04/17/22 1931   PRN Meds:.  Allergies as of 04/17/2022 - Review Complete 04/17/2022  Allergen Reaction Noted   Celebrex [celecoxib]  12/01/2021    Family History  Problem Relation Age of Onset   Dementia Neg Hx     Social History   Socioeconomic History   Marital status: Widowed    Spouse  name: Not on file   Number of children: Not on file   Years of education: Not on file   Highest education level: Not on file  Occupational History   Not on file  Tobacco Use   Smoking status: Never   Smokeless tobacco: Never  Vaping Use   Vaping Use: Never used  Substance and Sexual Activity   Alcohol use: Not Currently   Drug use: Never   Sexual activity: Not on file  Other Topics Concern   Not on file  Social History Narrative   Not on file   Social Determinants of Health   Financial Resource Strain: Not on file  Food Insecurity: Not on file  Transportation Needs: Not on file  Physical Activity: Not on file  Stress: Not on file  Social Connections: Not on file  Intimate Partner Violence: Not on file    Review of Systems: All negative except as stated above in HPI.  Physical Exam: Vital signs: Vitals:   04/17/22 1830 04/17/22 1946  BP: (!) 186/88 (!) 221/94  Pulse: (!) 57 60  Resp: 18 17  Temp:  98.6 F (37 C)  SpO2: 97% 96%     General:   Elderly, demented, thin, pleasant, no acute distress  Head: normocephalic, atraumatic Eyes: anicteric sclera ENT: oropharynx clear Neck: supple, nontender Lungs:  Clear throughout to auscultation.   No wheezes, crackles, or rhonchi. No acute distress. Heart:  Regular rate and rhythm; no murmurs, clicks, rubs,  or gallops. Abdomen: soft, nontender, nondistended, +BS  Rectal:  Deferred Ext: no edema  GI:  Lab Results: Recent Labs    04/17/22 1902  WBC 5.5  HGB 13.2  HCT 41.5  PLT 234   BMET No results for input(s): "NA", "K", "CL", "CO2", "GLUCOSE", "BUN", "CREATININE", "CALCIUM" in the last 72 hours. LFT No results for input(s): "PROT", "ALBUMIN", "AST", "ALT", "ALKPHOS", "BILITOT", "BILIDIR", "IBILI" in the last 72 hours. PT/INR No results for input(s): "LABPROT", "INR" in the last 72 hours.   Studies/Results: DG Chest 2 View  Result Date: 04/17/2022 CLINICAL DATA:  Possible aspiration EXAM: CHEST - 2  VIEW COMPARISON:  10/14/2018 FINDINGS: Moderate-sized hiatal hernia. Heart and mediastinal contours are within normal limits. No focal opacities or effusions. No acute bony abnormality. IMPRESSION: Hiatal hernia. No active cardiopulmonary disease. Electronically Signed   By: Kevin  Dover M.D.   On: 04/17/2022 17:05    Impression/Plan: 89 yo with dementia and hiatal hernia presenting with a food impaction in need of an urgent EGD. Will be admitted for IVFs and may need a speech path evaluation to determine what consistency of food is safe for her to eat. Supportive care.    LOS: 0 days   Jaxson Keener C Isela Stantz  04/17/2022, 7:58 PM  Questions please call [phone]  

## 2022-04-17 NOTE — ED Provider Notes (Signed)
[place] [facility]MENT AT [facility] Provider Note   CSN: [id number] Arrival date & time: 04/17/22  1529     History  Chief Complaint  Patient presents with   Possible Aspiration    Grace RamRosemary Owens is a 89 y.o. female.  HPI Patient's daughter-in-law gave her her Metamucil capsules at about 1 PM.  She reports she gets about 8 Metamucil capsules.  She was then shortly thereafter giving her her lunch.  She reports she made some mild enchiladas with avocado.  Patient had eaten about half of the meal when she started getting a gurgling and coughing sound.  Patient's daughter-in-law reports that she started to spit up foamy clear material and this gurgling and bringing up foamy secretions continued for over 10 minutes.  She reports it started to calm down and she tried a sip of water to see if the patient could tolerate it but it triggered the episode again.  She reports that now that they are in the emergency department it does seem to have abated quite a bit and the patient denies any pain or discomfort.  Patient's daughter-in-law reports that there have been occasional episodes of seeming like she is choking or regurgitating but has been fairly rare.  Last episode was about 6 months ago.  Patient's daughter-in-law denies that she has been diagnosed with food impaction or esophageal strictures.  The patient is alert and cheerful.  She however has significant dementia and does not really recall the details of what happened.    Home Medications Prior to Admission medications   Medication Sig Start Date End Date Taking? Authorizing Provider  acetaminophen (TYLENOL) 650 MG CR tablet Take 650 mg by mouth daily.    [provider]  aspirin 81 MG chewable tablet Chew 81 mg by mouth daily.    [provider]  benazepril (LOTENSIN) 20 MG tablet Take 20 mg by mouth daily.    [provider]  bisoprolol (ZEBETA) 10 MG tablet Take 1 tablet (10 mg total) by  mouth daily. 10/20/18 10/20/19  Marinda ElkFeliz Ortiz, Abraham, MD  Calcium Carbonate-Vitamin D (CALCIUM-VITAMIN D3 PO) Take 1 tablet by mouth every evening.    [provider]  Coenzyme Q10 (COQ10) 100 MG CAPS Take 1 tablet by mouth daily.    [provider]  diclofenac sodium (VOLTAREN) 1 % GEL Apply 2 g topically daily as needed (apply to left knee for pain).    [provider]  eplerenone (INSPRA) 25 MG tablet Take 1 tablet (25 mg total) by mouth every evening. 10/22/18   Marinda ElkFeliz Ortiz, Abraham, MD  gabapentin (NEURONTIN) 300 MG capsule Take 300 mg by mouth 3 (three) times daily.    [provider]  Glucosamine-Chondroitin 250-200 MG TABS Take 1 tablet by mouth daily.    [provider]  levothyroxine (SYNTHROID) 75 MCG tablet Take 75 mcg by mouth at bedtime.    [provider]  LUTEIN PO Take 1 tablet by mouth daily.    [provider]  mirabegron ER (MYRBETRIQ) 25 MG TB24 tablet Take 25 mg by mouth daily.    [provider]  Multiple Vitamin (MULTIVITAMIN WITH MINERALS) TABS tablet Take 1 tablet by mouth daily.    [provider]  Omega-3 Fatty Acids (FISH OIL) 1200 MG CAPS Take 1 capsule by mouth 2 (two) times daily.    [provider]  OVER THE COUNTER MEDICATION Take 1 tablet by mouth every evening. Medication: Cinnamon Bark  [provider]  Polyethyl Glycol-Propyl Glycol (SYSTANE FREE OP) Place 1 drop into both eyes 2 (two) times daily.    [provider]  Psyllium (METAMUCIL PO) Take 1 tablet by mouth daily.    [provider]  saccharomyces boulardii (FLORASTOR) 250 MG capsule Take 250 mg by mouth 2 (two) times daily.    [provider]  simvastatin (ZOCOR) 20 MG tablet Take 20 mg by mouth every evening.    [provider]  vitamin B-12 (CYANOCOBALAMIN) 1000 MCG tablet Take 1,000 mcg by mouth daily.    [provider]      Allergies    Celebrex  [celecoxib]    Review of Systems   Review of Systems  Physical Exam Updated Vital Signs BP (!) 186/88   Pulse (!) 57   Temp 98.7 F (37.1 C) (Oral)   Resp 18   LMP  (LMP Unknown)   SpO2 97%  Physical Exam Constitutional:      Comments: Patient is alert.  No acute distress.  Well-nourished well-developed good physical condition for age.  HENT:     Mouth/Throat:     Pharynx: Oropharynx is clear.  Eyes:     Extraocular Movements: Extraocular movements intact.  Cardiovascular:     Rate and Rhythm: Normal rate and regular rhythm.  Pulmonary:     Comments: No respiratory distress or stridor.  There is some slight crackle in the left base but I do not appreciate rhonchi. Abdominal:     General: There is no distension.     Palpations: Abdomen is soft.     Tenderness: There is no abdominal tenderness. There is no guarding.  Musculoskeletal:     Comments: Patient's feet are somewhat cool and pale.  Ankles have about 1+ edema.  Patient's daughter-in-law reports that this is actually very good compared to baseline.  She reports that the patient and other family members have "bad circulation" and typically the patient's feet are very blue in appearance.  She reports that improved some with elevation.  Skin:    General: Skin is warm and dry.  Neurological:     Comments: Patient is alert and cheerful.  She is situationally appropriate.  She does not have recall for events.   no focal neurologic deficits.  Psychiatric:        Mood and Affect: Mood normal.     ED Results / Procedures / Treatments   Labs (all labs ordered are listed, but only abnormal results are displayed) Labs Reviewed  COMPREHENSIVE METABOLIC PANEL  CBC WITH DIFFERENTIAL/PLATELET  LACTIC ACID, PLASMA  LACTIC ACID, PLASMA    EKG None  Radiology DG Chest 2 View  Result Date: 04/17/2022 CLINICAL DATA:  Possible aspiration EXAM: CHEST - 2 VIEW COMPARISON:  10/14/2018 FINDINGS: Moderate-sized hiatal hernia. Heart  and mediastinal contours are within normal limits. No focal opacities or effusions. No acute bony abnormality. IMPRESSION: Hiatal hernia. No active cardiopulmonary disease. Electronically Signed   By: Charlett NoseKevin  Dover M.D.   On: 04/17/2022 17:05    Procedures Procedures    Medications Ordered in ED Medications  lactated ringers infusion ( Intravenous New Bag/Given 04/17/22 1832)  pantoprazole (PROTONIX) injection 40 mg (40 mg Intravenous Given 04/17/22 1831)    ED Course/ Medical Decision Making/ A&P                             Medical Decision Making Amount and/or Complexity of Data Reviewed  Labs: ordered. Radiology: ordered.  Risk Prescription drug management. Decision regarding hospitalization.   Patient presents with an onset of a episode of coughing and regurgitating while having lunch.  Her typical regimen includes a dose of multiple Metamucil tablets before eating.  Her daughter-in-law reports that she tolerated these and there was no indication that the pills become lodged.  The patient ate about half of her lunch and then began a coughing and gurgling episode.  No food was regurgitated.  Symptoms started to spontaneously improve somewhat after transport and arrival to the emergency department.  Upon assessment patient is not actively showing any episodes of airway impingement.  She has no stridor and her voice is clear.  I do not appreciate any rhonchi.  Will proceed with chest x-ray.  Chest x-ray reviewed by radiology and also visually reviewed by myself shows a moderately sized hiatal hernia and no other acute findings.  With patient asymptomatic for an hour after observation in the emergency department will trial small sips of water.  Patient's daughter reports that she usually tolerates water and does not have any problem with thin liquids.  Nursing reports that after taking the water patient had episode of spitting up and having gurgling sound with secretions.  I reassessed the  patient and she does not have any appearance of airway impingement.  Symptoms have again abated.  At this time, with recurrent episode of regurgitation and not tolerating fluids, I am suspicious of the food impaction.  Symptoms started while the patient was having lunch.  At this time will establish peripheral IV and consult gastroenterology.  18: 50 consult: Reviewed Dr. Bosie Clos GI.        Final Clinical Impression(s) / ED Diagnoses Final diagnoses:  Food impaction of esophagus, initial encounter  Aspiration into airway, initial encounter    Rx / DC Orders ED Discharge Orders     None         Arby Barrette, MD 04/29/22 1138

## 2022-04-18 DIAGNOSIS — Z87440 Personal history of urinary (tract) infections: Secondary | ICD-10-CM | POA: Diagnosis not present

## 2022-04-18 DIAGNOSIS — K222 Esophageal obstruction: Secondary | ICD-10-CM

## 2022-04-18 DIAGNOSIS — Z792 Long term (current) use of antibiotics: Secondary | ICD-10-CM | POA: Diagnosis not present

## 2022-04-18 DIAGNOSIS — E1122 Type 2 diabetes mellitus with diabetic chronic kidney disease: Secondary | ICD-10-CM | POA: Diagnosis present

## 2022-04-18 DIAGNOSIS — Z888 Allergy status to other drugs, medicaments and biological substances status: Secondary | ICD-10-CM | POA: Diagnosis not present

## 2022-04-18 DIAGNOSIS — W44F3XA Food entering into or through a natural orifice, initial encounter: Secondary | ICD-10-CM

## 2022-04-18 DIAGNOSIS — E785 Hyperlipidemia, unspecified: Secondary | ICD-10-CM | POA: Diagnosis present

## 2022-04-18 DIAGNOSIS — F0390 Unspecified dementia without behavioral disturbance: Secondary | ICD-10-CM | POA: Diagnosis present

## 2022-04-18 DIAGNOSIS — I5032 Chronic diastolic (congestive) heart failure: Secondary | ICD-10-CM | POA: Diagnosis present

## 2022-04-18 DIAGNOSIS — E039 Hypothyroidism, unspecified: Secondary | ICD-10-CM | POA: Diagnosis present

## 2022-04-18 DIAGNOSIS — K2970 Gastritis, unspecified, without bleeding: Secondary | ICD-10-CM | POA: Diagnosis present

## 2022-04-18 DIAGNOSIS — D631 Anemia in chronic kidney disease: Secondary | ICD-10-CM | POA: Diagnosis present

## 2022-04-18 DIAGNOSIS — M48061 Spinal stenosis, lumbar region without neurogenic claudication: Secondary | ICD-10-CM | POA: Diagnosis present

## 2022-04-18 DIAGNOSIS — E1141 Type 2 diabetes mellitus with diabetic mononeuropathy: Secondary | ICD-10-CM | POA: Diagnosis present

## 2022-04-18 DIAGNOSIS — T18128A Food in esophagus causing other injury, initial encounter: Secondary | ICD-10-CM

## 2022-04-18 DIAGNOSIS — Z7982 Long term (current) use of aspirin: Secondary | ICD-10-CM | POA: Diagnosis not present

## 2022-04-18 DIAGNOSIS — I480 Paroxysmal atrial fibrillation: Secondary | ICD-10-CM | POA: Diagnosis present

## 2022-04-18 DIAGNOSIS — K449 Diaphragmatic hernia without obstruction or gangrene: Secondary | ICD-10-CM | POA: Diagnosis present

## 2022-04-18 DIAGNOSIS — T17908A Unspecified foreign body in respiratory tract, part unspecified causing other injury, initial encounter: Secondary | ICD-10-CM | POA: Diagnosis not present

## 2022-04-18 DIAGNOSIS — Z79890 Hormone replacement therapy (postmenopausal): Secondary | ICD-10-CM | POA: Diagnosis not present

## 2022-04-18 DIAGNOSIS — Z66 Do not resuscitate: Secondary | ICD-10-CM | POA: Diagnosis present

## 2022-04-18 DIAGNOSIS — Z79899 Other long term (current) drug therapy: Secondary | ICD-10-CM | POA: Diagnosis not present

## 2022-04-18 DIAGNOSIS — I447 Left bundle-branch block, unspecified: Secondary | ICD-10-CM | POA: Diagnosis present

## 2022-04-18 DIAGNOSIS — I252 Old myocardial infarction: Secondary | ICD-10-CM | POA: Diagnosis not present

## 2022-04-18 DIAGNOSIS — N1832 Chronic kidney disease, stage 3b: Secondary | ICD-10-CM | POA: Diagnosis present

## 2022-04-18 DIAGNOSIS — I130 Hypertensive heart and chronic kidney disease with heart failure and stage 1 through stage 4 chronic kidney disease, or unspecified chronic kidney disease: Secondary | ICD-10-CM | POA: Diagnosis present

## 2022-04-18 LAB — CBC
HCT: 36.8 % (ref 36.0–46.0)
Hemoglobin: 11.7 g/dL — ABNORMAL LOW (ref 12.0–15.0)
MCH: 32.1 pg (ref 26.0–34.0)
MCHC: 31.8 g/dL (ref 30.0–36.0)
MCV: 101.1 fL — ABNORMAL HIGH (ref 80.0–100.0)
Platelets: 215 10*3/uL (ref 150–400)
RBC: 3.64 MIL/uL — ABNORMAL LOW (ref 3.87–5.11)
RDW: 13.5 % (ref 11.5–15.5)
WBC: 7.7 10*3/uL (ref 4.0–10.5)
nRBC: 0 % (ref 0.0–0.2)

## 2022-04-18 LAB — MAGNESIUM: Magnesium: 2 mg/dL (ref 1.7–2.4)

## 2022-04-18 LAB — BASIC METABOLIC PANEL
Anion gap: 8 (ref 5–15)
BUN: 30 mg/dL — ABNORMAL HIGH (ref 8–23)
CO2: 27 mmol/L (ref 22–32)
Calcium: 9.1 mg/dL (ref 8.9–10.3)
Chloride: 106 mmol/L (ref 98–111)
Creatinine, Ser: 1.22 mg/dL — ABNORMAL HIGH (ref 0.44–1.00)
GFR, Estimated: 41 mL/min — ABNORMAL LOW (ref >60)
Glucose, Bld: 108 mg/dL — ABNORMAL HIGH (ref 70–99)
Potassium: 3.7 mmol/L (ref 3.5–5.1)
Sodium: 141 mmol/L (ref 135–145)

## 2022-04-18 LAB — PHOSPHORUS: Phosphorus: 3.4 mg/dL (ref 2.5–4.6)

## 2022-04-18 MED ORDER — DICLOFENAC SODIUM 1 % EX GEL
2.0000 g | Freq: Four times a day (QID) | CUTANEOUS | Status: DC
  Administered 2022-04-18 – 2022-04-21 (×14): 2 g via TOPICAL
  Filled 2022-04-18: qty 100

## 2022-04-18 MED ORDER — PANTOPRAZOLE SODIUM 40 MG IV SOLR
40.0000 mg | INTRAVENOUS | Status: DC
  Administered 2022-04-18 – 2022-04-21 (×4): 40 mg via INTRAVENOUS
  Filled 2022-04-18 (×4): qty 10

## 2022-04-18 MED ORDER — ACETAMINOPHEN 500 MG PO TABS
1000.0000 mg | ORAL_TABLET | Freq: Two times a day (BID) | ORAL | Status: DC
  Administered 2022-04-18 – 2022-04-21 (×7): 1000 mg via ORAL
  Filled 2022-04-18 (×7): qty 2

## 2022-04-18 MED ORDER — DICLOFENAC SODIUM 1 % TD GEL: 2.0000 g | Freq: Four times a day (QID) | TRANSDERMAL | Status: DC

## 2022-04-18 MED ORDER — POLYVINYL ALCOHOL 1.4 % OP SOLN: 1.0000 [drp] | OPHTHALMIC | Status: DC | PRN

## 2022-04-18 MED ORDER — GABAPENTIN 100 MG PO CAPS
100.0000 mg | ORAL_CAPSULE | Freq: Three times a day (TID) | ORAL | Status: DC
  Administered 2022-04-18 – 2022-04-19 (×4): 100 mg via ORAL
  Filled 2022-04-18 (×4): qty 1

## 2022-04-18 NOTE — Progress Notes (Signed)
PROGRESS NOTE    Grace Owens  RRN:[id number] DOB: Mar 16, 1926 DOA: 04/17/2022 PCP: Ezequiel Kayser, MD    Brief Narrative:  Grace Owens is a 89 y.o. female with medical history significant for dementia, paroxysmal A-fib on Eliquis, chronic diastolic CHF, hyperlipidemia, hypothyroidism, hypertension, polyneuropathy, CKD 3B, history of urinary tract infection on prophylactic Keflex and on daily probiotics, hiatal hernia, who presented to [facility] ED with concern for recurrent aspiration.  During her lunch she choked up on her meal per her family account.  She has had a similar episode in the past but this episode lasted longer.  Patient's family brought her into the ED for further evaluation. -s/p EGD   Assessment and Plan: Aspiration into airway, rule out food impaction, esophageal stricture. Chest x-ray nonacute, hiatal hernia seen on chest x-ray. Seen by GI, s/p EGD: 04/17/22-  Food in the esophagus. Removal was successful.  Dilation in the distal esophagus. SLP eval-- clears and MBBS planned Aspiration precaution in place.   Hypothyroidism Resume home levothyroxine    Recurrent UTI, on prophylactic Keflex Resume home Keflex   Hypertension Resume home oral antihypertensives    Hyperlipidemia Resume home regimen   Chronic lumbar stenosis/polyneuropathy Resume home gabapentin    Dementia Reorient as needed -delirium precautions    CKD 3B At baseline Avoid nephrotoxic agents, dehydration and hypotension Gentle IV fluid hydration LR 50 cc/h x1 day  H/o p a fib -on BB and eliquis    DVT prophylaxis: apixaban (ELIQUIS) tablet 2.5 mg Start: 04/18/22 1000 apixaban (ELIQUIS) tablet 2.5 mg    Code Status: DNR Family Communication: called daughter  Disposition Plan:  Level of care: Telemetry Status is: Observation The patient will require care spanning > 2 midnights and should be moved to inpatient     Consultants:  GI   Subjective: No current complaints    Objective: Vitals:   04/17/22 2348 04/18/22 0220 04/18/22 0250 04/18/22 0810  BP: (!) 163/63 (!) 140/68 128/66 (!) 124/48  Pulse: (!) 57 68 100 71  Resp:   19 17  Temp:   98.1 F (36.7 C) 99.2 F (37.3 C)  TempSrc:   Oral Oral  SpO2:   99%   Weight:   72 kg   Height:        Intake/Output Summary (Last 24 hours) at 04/18/2022 1030 Last data filed at 04/18/2022 0830 Gross per 24 hour  Intake 620 ml  Output 1050 ml  Net -430 ml   Filed Weights   04/17/22 1954 04/18/22 0250  Weight: 63.5 kg 72 kg    Examination:   General: Appearance:     Overweight female in no acute distress     Lungs:     respirations unlabored  Heart:    Normal heart rate.     MS:   All extremities are intact.    Neurologic:   Awake, alert, pleasantly confused       Data Reviewed: I have personally reviewed following labs and imaging studies  CBC: Recent Labs  Lab 04/17/22 1902 04/18/22 0503  WBC 5.5 7.7  NEUTROABS 3.6  --   HGB 13.2 11.7*  HCT 41.5 36.8  MCV 101.0* 101.1*  PLT 234 215   Basic Metabolic Panel: Recent Labs  Lab 04/17/22 1902 04/18/22 0503  NA 137 141  K 4.6 3.7  CL 102 106  CO2 25 27  GLUCOSE 110* 108*  BUN 34* 30*  CREATININE 1.25* 1.22*  CALCIUM 9.5 9.1  MG  --  2.0  PHOS  --  3.4   GFR: Estimated Creatinine Clearance: 29.3 mL/min (A) (by C-G formula based on SCr of 1.22 mg/dL (H)). Liver Function Tests: Recent Labs  Lab 04/17/22 1902  AST 33  ALT 19  ALKPHOS 51  BILITOT 0.8  PROT 7.4  ALBUMIN 4.2   No results for input(s): "LIPASE", "AMYLASE" in the last 168 hours. No results for input(s): "AMMONIA" in the last 168 hours. Coagulation Profile: No results for input(s): "INR", "PROTIME" in the last 168 hours. Cardiac Enzymes: No results for input(s): "CKTOTAL", "CKMB", "CKMBINDEX", "TROPONINI" in the last 168 hours. BNP (last 3 results) No results for input(s): "PROBNP" in the last 8760 hours. HbA1C: No results for input(s): "HGBA1C" in the  last 72 hours. CBG: Recent Labs  Lab 04/17/22 2156  GLUCAP 110*   Lipid Profile: No results for input(s): "CHOL", "HDL", "LDLCALC", "TRIG", "CHOLHDL", "LDLDIRECT" in the last 72 hours. Thyroid Function Tests: No results for input(s): "TSH", "T4TOTAL", "FREET4", "T3FREE", "THYROIDAB" in the last 72 hours. Anemia Panel: No results for input(s): "VITAMINB12", "FOLATE", "FERRITIN", "TIBC", "IRON", "RETICCTPCT" in the last 72 hours. Sepsis Labs: Recent Labs  Lab 04/17/22 1913  LATICACIDVEN 1.5    No results found for this or any previous visit (from the past 240 hour(s)).       Radiology Studies: DG Chest 2 View  Result Date: 04/17/2022 CLINICAL DATA:  Possible aspiration EXAM: CHEST - 2 VIEW COMPARISON:  10/14/2018 FINDINGS: Moderate-sized hiatal hernia. Heart and mediastinal contours are within normal limits. No focal opacities or effusions. No acute bony abnormality. IMPRESSION: Hiatal hernia. No active cardiopulmonary disease. Electronically Signed   By: Charlett Nose M.D.   On: 04/17/2022 17:05        Scheduled Meds:  acetaminophen  1,000 mg Oral BID   apixaban  2.5 mg Oral BID   benazepril  20 mg Oral BID   bisoprolol  5 mg Oral Daily   cephALEXin  250 mg Oral Daily   cyanocobalamin  1,000 mcg Oral Daily   diclofenac Sodium  2 g Topical QID   gabapentin  100 mg Oral TID   levothyroxine  100 mcg Oral Q0600   multivitamin with minerals  1 tablet Oral QHS   pantoprazole (PROTONIX) IV  40 mg Intravenous Q24H   saccharomyces boulardii  500 mg Oral BID   simvastatin  20 mg Oral QPM   Continuous Infusions:  lactated ringers 50 mL/hr at 04/18/22 1003     LOS: 0 days    Time spent: 45 minutes spent on chart review, discussion with nursing staff, consultants, updating family and interview/physical exam; more than 50% of that time was spent in counseling and/or coordination of care.    Joseph Art, DO [facility] Available via Epic secure chat  7am-7pm After these hours, please refer to coverage provider listed on [email or web address] 04/18/2022, 10:30 AM

## 2022-04-18 NOTE — Evaluation (Signed)
Clinical/Bedside Swallow Evaluation Patient Details  Name: Grace Owens MRN: [id number] Date of Birth: 12-17-1926  Today's Date: 04/18/2022 Time: SLP Start Time (ACUTE ONLY): 0825 SLP Stop Time (ACUTE ONLY): 0840 SLP Time Calculation (min) (ACUTE ONLY): 15 min  Past Medical History:  Past Medical History:  Diagnosis Date   A-fib    CHF (congestive heart failure)    Dementia    Diabetes mellitus without complication    Hypertension    Hypothyroidism    Myocardial infarct    Pinched nerve    Past Surgical History:  Past Surgical History:  Procedure Laterality Date   APPENDECTOMY     BACK SURGERY     THYROIDECTOMY, PARTIAL     HPI:  Patient is a 89 y.o. female with PMH: dementia, a-fib, chronic diastolic CHF, HLD, hypothyroidism, HTN, polyneuropathy, CKD 3b, h/o UTI, hiatal hernia. She presented to [facility] ED on 04/17/22 due to concern for recurrent aspiration. Per family report, she choked on  her lunch meal and has had a similar episode in the past. GI performed urgent EGD and found and removed food which was impacted throughout esophagus and dilated patient's distal esophagus. She was made NPO until SLP swallow evaluation.    Assessment / Plan / Recommendation  Clinical Impression  Patient presents with clinical s/s of dysphagia as per this bedside swallow evaluation. She was awake, alert, pleasantly confused (not oriented to place, time, etc). She drank thin liquids (water, juice) via cup sips. SLP suspected mild delay in swallow initiation, however no overt s/s aspiration or penetration during or after PO intake. In light of patient's recent findings of food impaction in esophagus, no solids were attempted. SLP recommending initiate PO diet of clear liquids (thin consistency) and will likely proceed with MBS in next 1-2 dates. SLP Visit Diagnosis: Dysphagia, unspecified (R13.10)    Aspiration Risk  Mild aspiration risk    Diet Recommendation Thin liquid;Other (Comment) (clear  liquids)   Liquid Administration via: Cup Medication Administration: Crushed with puree Supervision: Patient able to self feed Compensations: Small sips/bites;Slow rate Postural Changes: Seated upright at 90 degrees    Other  Recommendations Oral Care Recommendations: Oral care BID    Recommendations for follow up therapy are one component of a multi-disciplinary discharge planning process, led by the attending physician.  Recommendations may be updated based on patient status, additional functional criteria and insurance authorization.  Follow up Recommendations Other (comment) (TBD)      Assistance Recommended at Discharge    Functional Status Assessment Patient has had a recent decline in their functional status and demonstrates the ability to make significant improvements in function in a reasonable and predictable amount of time.  Frequency and Duration min 2x/week  1 week       Prognosis Prognosis for improved oropharyngeal function: Good Barriers to Reach Goals: Cognitive deficits      Swallow Study   General Date of Onset: 04/17/22 HPI: Patient is a 89 y.o. female with PMH: dementia, a-fib, chronic diastolic CHF, HLD, hypothyroidism, HTN, polyneuropathy, CKD 3b, h/o UTI, hiatal hernia. She presented to [facility] ED on 04/17/22 due to concern for recurrent aspiration. Per family report, she choked on  her lunch meal and has had a similar episode in the past. GI performed urgent EGD and found and removed food which was impacted throughout esophagus and dilated patient's distal esophagus. She was made NPO until SLP swallow evaluation. Type of Study: Bedside Swallow Evaluation Previous Swallow Assessment: none found for SLP, EGD 04/17/22  Diet Prior to this Study: NPO Temperature Spikes Noted: No Respiratory Status: Room air History of Recent Intubation: Yes Total duration of intubation (days):  (for EGD  only) Behavior/Cognition: Alert;Cooperative;Pleasant mood;Confused Oral Cavity  Assessment: Within Functional Limits Oral Care Completed by SLP: No Oral Cavity - Dentition: Adequate natural dentition Vision: Functional for self-feeding Self-Feeding Abilities: Able to feed self Patient Positioning: Upright in bed Baseline Vocal Quality: Normal Volitional Cough: Cognitively unable to elicit Volitional Swallow: Able to elicit    Oral/Motor/Sensory Function Overall Oral Motor/Sensory Function: Within functional limits   Ice Chips     Thin Liquid Thin Liquid: Impaired Presentation: Self Fed;Cup Pharyngeal  Phase Impairments: Suspected delayed Swallow    Nectar Thick     Honey Thick     Puree Puree: Not tested   Solid     Solid: Not tested      Angela NevinJohn T. Adamaris King, MA, CCC-SLP Speech Therapy

## 2022-04-18 NOTE — Evaluation (Signed)
Physical Therapy Evaluation Patient Details Name: Grace Owens MRN: [id number] DOB: 18-Mar-1926 Today's Date: 04/18/2022  History of Present Illness  Pt is a 89yo female presenting to [facility] ED on 4/5 with concern for recurrent aspiration; s/p EGD with food in esophagus removed during procedure.  PMH: dmentia, PAF, CHF, HLD, hypothyroidism, HTN, polyneuropathy, CKD3, hx of MI, hx of UTI, hiatal hernia.   Clinical Impression  Pt admitted with above diagnosis; currently with functional limitations due to the deficits listed below (see PT Problem List). Pt pleasantly confused and struggling with command following requiring maxA+1 for bed mobility and transfers secondary to confusion; posterior lean in stance that required up to minA to correct. Once in standing with RW, pt able to ambulate ~54ft with close min guard, distance limited by bilateral knee pain.  Pt will benefit from acute skilled PT to increase their independence and safety with mobility to allow discharge.         Recommendations for follow up therapy are one component of a multi-disciplinary discharge planning process, led by the attending physician.  Recommendations may be updated based on patient status, additional functional criteria and insurance authorization.  Follow Up Recommendations Can patient physically be transported by private vehicle: Yes     Assistance Recommended at Discharge Frequent or constant Supervision/Assistance  Patient can return home with the following  A lot of help with walking and/or transfers;A lot of help with bathing/dressing/bathroom;Assistance with cooking/housework;Direct supervision/assist for financial management;Assistance with feeding;Direct supervision/assist for medications management;Assist for transportation;Help with stairs or ramp for entrance    Equipment Recommendations None recommended by PT (TBD)  Recommendations for Other Services       Functional Status Assessment Patient has had a  recent decline in their functional status and demonstrates the ability to make significant improvements in function in a reasonable and predictable amount of time.     Precautions / Restrictions Precautions Precautions: Fall Restrictions Weight Bearing Restrictions: No      Mobility  Bed Mobility Overal bed mobility: Needs Assistance Bed Mobility: Supine to Sit     Supine to sit: Max assist, HOB elevated     General bed mobility comments: MaxA for bringing pt upright    Transfers Overall transfer level: Needs assistance Equipment used: Rolling walker (2 wheels) Transfers: Sit to/from Stand, Bed to chair/wheelchair/BSC Sit to Stand: Max assist   Step pivot transfers: Max assist       General transfer comment: MaxA for lift assistance, once in standing pt minA to correct posterior lean, once corrected min guard with gait belt.    Ambulation/Gait Ambulation/Gait assistance: Min guard Gait Distance (Feet): 20 Feet Assistive device: Rolling walker (2 wheels) Gait Pattern/deviations: Step-to pattern, Decreased stance time - right, Decreased stance time - left, Knees buckling Gait velocity: decreased     General Gait Details: Pt ambulated ~15ft with RW and min guard, no physical assist required or overt LOB noted, pt reporting some bilateral knee pain.  Stairs            Wheelchair Mobility    Modified Rankin (Stroke Patients Only)       Balance Overall balance assessment: Needs assistance Sitting-balance support: Feet supported, No upper extremity supported Sitting balance-Leahy Scale: Good     Standing balance support: Reliant on assistive device for balance, During functional activity, Bilateral upper extremity supported Standing balance-Leahy Scale: Fair  Pertinent Vitals/Pain Pain Assessment Pain Assessment: Faces Faces Pain Scale: Hurts a little bit Pain Location: bilateral knees Pain Descriptors /  Indicators: Sore Pain Intervention(s): Limited activity within patient's tolerance, Monitored during session, Repositioned    Home Living Family/patient expects to be discharged to:: Private residence Living Arrangements: Children                 Additional Comments: Pt an unreliable historyian    Prior Function Prior Level of Function : Patient poor historian/Family not available                     Hand Dominance        Extremity/Trunk Assessment   Upper Extremity Assessment Upper Extremity Assessment: Overall WFL for tasks assessed    Lower Extremity Assessment Lower Extremity Assessment: Overall WFL for tasks assessed    Cervical / Trunk Assessment Cervical / Trunk Assessment: Kyphotic  Communication   Communication: HOH  Cognition Arousal/Alertness: Awake/alert Behavior During Therapy: WFL for tasks assessed/performed Overall Cognitive Status: No family/caregiver present to determine baseline cognitive functioning                                          General Comments      Exercises     Assessment/Plan    PT Assessment Patient needs continued PT services  PT Problem List Decreased strength;Decreased range of motion;Decreased activity tolerance;Decreased balance;Decreased mobility;Decreased cognition;Decreased coordination;Decreased safety awareness;Pain       PT Treatment Interventions DME instruction;Wheelchair mobility training;Patient/family education;Cognitive remediation;Neuromuscular re-education;Balance training;Therapeutic exercise;Therapeutic activities;Functional mobility training;Stair training;Gait training    PT Goals (Current goals can be found in the Care Plan section)  Acute Rehab PT Goals Patient Stated Goal: unable to state PT Goal Formulation: Patient unable to participate in goal setting Time For Goal Achievement: 05/02/22 Potential to Achieve Goals: Fair    Frequency Min 3X/week      Co-evaluation               AM-PAC PT "6 Clicks" Mobility  Outcome Measure Help needed turning from your back to your side while in a flat bed without using bedrails?: A Lot Help needed moving from lying on your back to sitting on the side of a flat bed without using bedrails?: A Lot Help needed moving to and from a bed to a chair (including a wheelchair)?: A Lot Help needed standing up from a chair using your arms (e.g., wheelchair or bedside chair)?: A Lot Help needed to walk in hospital room?: A Little Help needed climbing 3-5 steps with a railing? : Total 6 Click Score: 12    End of Session Equipment Utilized During Treatment: Gait belt Activity Tolerance: Patient tolerated treatment well;No increased pain Patient left: in chair;with call bell/phone within reach;with chair alarm set Nurse Communication: Mobility status PT Visit Diagnosis: Unsteadiness on feet (R26.81);Difficulty in walking, not elsewhere classified (R26.2)    Time: [id number] PT Time Calculation (min) (ACUTE ONLY): 22 min   Charges:   PT Evaluation $PT Eval Low Complexity: 1 Low          Jamesetta GeraldsWilliam Aiko Belko, PT, DPT [facility] Rehabilitation Department Office: [phone] Weekend pager: [phone]  Jamesetta GeraldsWilliam Myriah Boggus 04/18/2022, 3:03 PM

## 2022-04-19 ENCOUNTER — Other Ambulatory Visit: Payer: Self-pay

## 2022-04-19 DIAGNOSIS — T17908A Unspecified foreign body in respiratory tract, part unspecified causing other injury, initial encounter: Secondary | ICD-10-CM | POA: Diagnosis not present

## 2022-04-19 DIAGNOSIS — W44F3XA Food entering into or through a natural orifice, initial encounter: Secondary | ICD-10-CM | POA: Diagnosis not present

## 2022-04-19 DIAGNOSIS — T18128A Food in esophagus causing other injury, initial encounter: Secondary | ICD-10-CM | POA: Diagnosis not present

## 2022-04-19 LAB — CBC
HCT: 31.2 % — ABNORMAL LOW (ref 36.0–46.0)
Hemoglobin: 10 g/dL — ABNORMAL LOW (ref 12.0–15.0)
MCH: 32.2 pg (ref 26.0–34.0)
MCHC: 32.1 g/dL (ref 30.0–36.0)
MCV: 100.3 fL — ABNORMAL HIGH (ref 80.0–100.0)
Platelets: 186 10*3/uL (ref 150–400)
RBC: 3.11 MIL/uL — ABNORMAL LOW (ref 3.87–5.11)
RDW: 13.8 % (ref 11.5–15.5)
WBC: 4.7 10*3/uL (ref 4.0–10.5)
nRBC: 0 % (ref 0.0–0.2)

## 2022-04-19 LAB — BASIC METABOLIC PANEL
Anion gap: 5 (ref 5–15)
BUN: 24 mg/dL — ABNORMAL HIGH (ref 8–23)
CO2: 27 mmol/L (ref 22–32)
Calcium: 8.6 mg/dL — ABNORMAL LOW (ref 8.9–10.3)
Chloride: 108 mmol/L (ref 98–111)
Creatinine, Ser: 1.23 mg/dL — ABNORMAL HIGH (ref 0.44–1.00)
GFR, Estimated: 41 mL/min — ABNORMAL LOW (ref >60)
Glucose, Bld: 112 mg/dL — ABNORMAL HIGH (ref 70–99)
Potassium: 4 mmol/L (ref 3.5–5.1)
Sodium: 140 mmol/L (ref 135–145)

## 2022-04-19 MED ORDER — AMLODIPINE BESYLATE 5 MG PO TABS
2.5000 mg | ORAL_TABLET | Freq: Every day | ORAL | Status: DC
  Administered 2022-04-19: 2.5 mg via ORAL
  Filled 2022-04-19: qty 1

## 2022-04-19 MED ORDER — GABAPENTIN 300 MG PO CAPS
300.0000 mg | ORAL_CAPSULE | Freq: Every day | ORAL | Status: DC
  Administered 2022-04-20: 300 mg via ORAL
  Filled 2022-04-19: qty 1

## 2022-04-19 NOTE — Progress Notes (Signed)
Pt bradycardic on the monitor. Seen as low at 39 to 40s. MD aware, new orders placed. Pt not symptomatic, comfortably sitting in chair.   Will continue to monitor.

## 2022-04-19 NOTE — Progress Notes (Signed)
PROGRESS NOTE    Temiloluwa Lacefield  BRK:[id number] DOB: 26-Aug-1926 DOA: 04/17/2022 PCP: Ezequiel Kayser, MD    Brief Narrative:  Nubia Stmarie is a 89 y.o. female with medical history significant for dementia, paroxysmal A-fib on Eliquis, chronic diastolic CHF, hyperlipidemia, hypothyroidism, hypertension, polyneuropathy, CKD 3B, history of urinary tract infection on prophylactic Keflex and on daily probiotics, hiatal hernia, who presented to [facility] ED with concern for recurrent aspiration.  During her lunch she choked up on her meal per her family account.  She has had a similar episode in the past but this episode lasted longer.  Patient's family brought her into the ED for further evaluation. -s/p EGD   Assessment and Plan: Aspiration into airway, rule out food impaction, esophageal stricture. Chest x-ray nonacute, hiatal hernia seen on chest x-ray. Seen by GI, s/p EGD: 04/17/22-  Food in the esophagus. Removal was successful.  Dilation in the distal esophagus. SLP eval-- clears and MBBS planned Aspiration precaution in place.   Hypothyroidism Resume home levothyroxine    Recurrent UTI, on prophylactic Keflex Resume home Keflex   Hypertension Resume home oral antihypertensives  -add norvasc   Hyperlipidemia Resume home regimen   Chronic lumbar stenosis/polyneuropathy Resume home gabapentin    Dementia Reorient as needed -delirium precautions    CKD 3B At baseline Avoid nephrotoxic agents, dehydration and hypotension Gentle IV fluid hydration LR 50 cc/h x1 day  H/o p a fib -on bisprolol and eliquis  Anemia -monitor   DVT prophylaxis: apixaban (ELIQUIS) tablet 2.5 mg Start: 04/18/22 1000 apixaban (ELIQUIS) tablet 2.5 mg    Code Status: DNR Family Communication: called daughter 4/6  Disposition Plan:  Level of care: Med-Surg Status is: inpt  Consultants:  GI   Subjective: Sleeping but no overnight issues  Objective: Vitals:   04/18/22 2114 04/19/22  0500 04/19/22 0606 04/19/22 0957  BP:  (!) 160/79 (!) 152/60 (!) 160/61  Pulse:  68 (!) 56 60  Resp:      Temp: 98.7 F (37.1 C) 98.7 F (37.1 C)    TempSrc: Oral Oral    SpO2:  97%    Weight:  72.1 kg    Height:        Intake/Output Summary (Last 24 hours) at 04/19/2022 1031 Last data filed at 04/19/2022 0412 Gross per 24 hour  Intake 1099.73 ml  Output 850 ml  Net 249.73 ml   Filed Weights   04/17/22 1954 04/18/22 0250 04/19/22 0500  Weight: 63.5 kg 72 kg 72.1 kg    Examination:    General: Appearance:     Overweight female in no acute distress     Lungs:     respirations unlabored  Heart:    Normal heart rate. Normal rhythm. No murmurs, rubs, or gallops.   MS:   All extremities are intact.   Neurologic:   Sleeping but will awaken         Data Reviewed: I have personally reviewed following labs and imaging studies  CBC: Recent Labs  Lab 04/17/22 1902 04/18/22 0503 04/19/22 0406  WBC 5.5 7.7 4.7  NEUTROABS 3.6  --   --   HGB 13.2 11.7* 10.0*  HCT 41.5 36.8 31.2*  MCV 101.0* 101.1* 100.3*  PLT 234 215 186   Basic Metabolic Panel: Recent Labs  Lab 04/17/22 1902 04/18/22 0503 04/19/22 0406  NA 137 141 140  K 4.6 3.7 4.0  CL 102 106 108  CO2 25 27 27   GLUCOSE 110* 108* 112*  BUN  34* 30* 24*  CREATININE 1.25* 1.22* 1.23*  CALCIUM 9.5 9.1 8.6*  MG  --  2.0  --   PHOS  --  3.4  --    GFR: Estimated Creatinine Clearance: 29.1 mL/min (A) (by C-G formula based on SCr of 1.23 mg/dL (H)). Liver Function Tests: Recent Labs  Lab 04/17/22 1902  AST 33  ALT 19  ALKPHOS 51  BILITOT 0.8  PROT 7.4  ALBUMIN 4.2   No results for input(s): "LIPASE", "AMYLASE" in the last 168 hours. No results for input(s): "AMMONIA" in the last 168 hours. Coagulation Profile: No results for input(s): "INR", "PROTIME" in the last 168 hours. Cardiac Enzymes: No results for input(s): "CKTOTAL", "CKMB", "CKMBINDEX", "TROPONINI" in the last 168 hours. BNP (last 3  results) No results for input(s): "PROBNP" in the last 8760 hours. HbA1C: No results for input(s): "HGBA1C" in the last 72 hours. CBG: Recent Labs  Lab 04/17/22 2156  GLUCAP 110*   Lipid Profile: No results for input(s): "CHOL", "HDL", "LDLCALC", "TRIG", "CHOLHDL", "LDLDIRECT" in the last 72 hours. Thyroid Function Tests: No results for input(s): "TSH", "T4TOTAL", "FREET4", "T3FREE", "THYROIDAB" in the last 72 hours. Anemia Panel: No results for input(s): "VITAMINB12", "FOLATE", "FERRITIN", "TIBC", "IRON", "RETICCTPCT" in the last 72 hours. Sepsis Labs: Recent Labs  Lab 04/17/22 1913  LATICACIDVEN 1.5    No results found for this or any previous visit (from the past 240 hour(s)).       Radiology Studies: DG Chest 2 View  Result Date: 04/17/2022 CLINICAL DATA:  Possible aspiration EXAM: CHEST - 2 VIEW COMPARISON:  10/14/2018 FINDINGS: Moderate-sized hiatal hernia. Heart and mediastinal contours are within normal limits. No focal opacities or effusions. No acute bony abnormality. IMPRESSION: Hiatal hernia. No active cardiopulmonary disease. Electronically Signed   By: Charlett Nose M.D.   On: 04/17/2022 17:05        Scheduled Meds:  acetaminophen  1,000 mg Oral BID   apixaban  2.5 mg Oral BID   benazepril  20 mg Oral BID   bisoprolol  5 mg Oral Daily   cephALEXin  250 mg Oral Daily   cyanocobalamin  1,000 mcg Oral Daily   diclofenac Sodium  2 g Topical QID   gabapentin  100 mg Oral TID   levothyroxine  100 mcg Oral Q0600   multivitamin with minerals  1 tablet Oral QHS   pantoprazole (PROTONIX) IV  40 mg Intravenous Q24H   saccharomyces boulardii  500 mg Oral BID   simvastatin  20 mg Oral QPM   Continuous Infusions:     LOS: 1 day    Time spent: 45 minutes spent on chart review, discussion with nursing staff, consultants, updating family and interview/physical exam; more than 50% of that time was spent in counseling and/or coordination of care.    Joseph Art, DO [facility] Available via Epic secure chat 7am-7pm After these hours, please refer to coverage provider listed on [email or web address] 04/19/2022, 10:31 AM

## 2022-04-20 ENCOUNTER — Inpatient Hospital Stay (HOSPITAL_COMMUNITY): Payer: Medicare Other

## 2022-04-20 DIAGNOSIS — T18128A Food in esophagus causing other injury, initial encounter: Secondary | ICD-10-CM | POA: Diagnosis not present

## 2022-04-20 DIAGNOSIS — T17908A Unspecified foreign body in respiratory tract, part unspecified causing other injury, initial encounter: Secondary | ICD-10-CM | POA: Diagnosis not present

## 2022-04-20 DIAGNOSIS — W44F3XA Food entering into or through a natural orifice, initial encounter: Secondary | ICD-10-CM | POA: Diagnosis not present

## 2022-04-20 LAB — CBC
HCT: 40.1 % (ref 36.0–46.0)
Hemoglobin: 12.9 g/dL (ref 12.0–15.0)
MCH: 32 pg (ref 26.0–34.0)
MCHC: 32.2 g/dL (ref 30.0–36.0)
MCV: 99.5 fL (ref 80.0–100.0)
Platelets: 232 10*3/uL (ref 150–400)
RBC: 4.03 MIL/uL (ref 3.87–5.11)
RDW: 13.4 % (ref 11.5–15.5)
WBC: 8.1 10*3/uL (ref 4.0–10.5)
nRBC: 0 % (ref 0.0–0.2)

## 2022-04-20 MED ORDER — AMLODIPINE BESYLATE 10 MG PO TABS
5.0000 mg | ORAL_TABLET | Freq: Every day | ORAL | Status: DC
  Administered 2022-04-20 – 2022-04-21 (×2): 5 mg via ORAL
  Filled 2022-04-20 (×2): qty 1

## 2022-04-20 MED ORDER — MELATONIN 3 MG PO TABS
3.0000 mg | ORAL_TABLET | Freq: Every day | ORAL | Status: DC
  Administered 2022-04-20: 3 mg via ORAL
  Filled 2022-04-20: qty 1

## 2022-04-20 MED ORDER — SACCHAROMYCES BOULARDII 250 MG PO CAPS
500.0000 mg | ORAL_CAPSULE | Freq: Two times a day (BID) | ORAL | Status: DC
  Administered 2022-04-20 – 2022-04-21 (×3): 500 mg via ORAL
  Filled 2022-04-20 (×2): qty 2

## 2022-04-20 MED ORDER — BISACODYL 10 MG RE SUPP: 10.0000 mg | Freq: Every day | RECTAL | Status: DC | PRN

## 2022-04-20 MED ORDER — FLEET ENEMA 7-19 GM/118ML RE ENEM
1.0000 | ENEMA | Freq: Once | RECTAL | Status: AC
  Administered 2022-04-20: 1 via RECTAL
  Filled 2022-04-20: qty 1

## 2022-04-20 NOTE — TOC Initial Note (Signed)
Transition of Care [facility]) - Initial/Assessment Note    Patient Details  Name: Grace Owens MRN: [id number] Date of Birth: [birth year]  [facility]) CM/SW Contact:    Larrie Kass, LCSW Phone Number: 04/20/2022, 11:41 AM  Clinical Narrative:                 CSW spoke with pt's daughter Olivianna Halas regarding rec for SNF placement, she stated she would like to speak with pt's family about the decision. She reports that the pt switches from living with her in [place] or her brother in [place]. She reports pt is active with [facility] services. CSW explained the process for placement if they decided to choose this option. Pt's daughter stated she would call pt back. TOC to follow.    Expected Discharge Plan:  (TBD) Barriers to Discharge: Continued Medical Work up   Patient Goals and CMS Choice            Expected Discharge Plan and Services       Living arrangements for the past 2 months: Single Family Home                                      Prior Living Arrangements/Services Living arrangements for the past 2 months: Single Family Home Lives with:: Self, Adult Children Patient language and need for interpreter reviewed:: Yes Do you feel safe going back to the place where you live?: Yes      Need for Family Participation in Patient Care: Yes (Comment) Care giver support system in place?: Yes (comment) Current home services: Home OT, Home PT Criminal Activity/Legal Involvement Pertinent to Current Situation/Hospitalization: No - Comment as needed  Activities of Daily Living Home Assistive Devices/Equipment: Eyeglasses, Grab bars around toilet ADL Screening (condition at time of admission) Patient's cognitive ability adequate to safely complete daily activities?: No Is the patient deaf or have difficulty hearing?: No Does the patient have difficulty seeing, even when wearing glasses/contacts?: No Does the patient have difficulty  concentrating, remembering, or making decisions?: Yes Patient able to express need for assistance with ADLs?: No Does the patient have difficulty dressing or bathing?: Yes Independently performs ADLs?: Yes (appropriate for developmental age) Does the patient have difficulty walking or climbing stairs?: Yes Weakness of Legs: Both Weakness of Arms/Hands: Both  Permission Sought/Granted                  Emotional Assessment Appearance:: Appears stated age     Orientation: : Oriented to Self   Psych Involvement: No (comment)  Admission diagnosis:  Food impaction of esophagus, initial encounter [M76.720N, W44.F3XA] Aspiration into airway [T17.908A] Aspiration into airway, initial encounter [T17.908A] Stricture esophagus [K22.2] Patient Active Problem List   Diagnosis Date Noted   Stricture esophagus 04/18/2022   Aspiration into airway 04/17/2022   Food impaction of esophagus 04/17/2022   Hyponatremia 10/14/2018   Dementia 10/14/2018   Diastolic CHF 10/14/2018   CKD (chronic kidney disease) stage 3, GFR 30-59 ml/min 10/14/2018   Acute encephalopathy 10/14/2018   PCP:  Ezequiel Kayser, MD Pharmacy:   [facility] [phone] [facility] - [place], [place] - [address] [facility] [place] [address] Phone: [phone] Fax: [phone]     Social Determinants of Health (SDOH) Social History: SDOH Screenings   Food Insecurity: Patient Unable To Answer (04/19/2022)  Housing: Low Risk  (04/18/2022)  Transportation Needs: Patient Unable To Answer (04/19/2022)  Utilities: Patient Unable To Answer (04/19/2022)  Tobacco Use: Low Risk  (04/17/2022)   SDOH Interventions:     Readmission Risk Interventions     No data to display

## 2022-04-20 NOTE — Progress Notes (Signed)
Modified Barium Swallow Study  Patient Details  Name: Grace Owens MRN: [id number] Date of Birth: Mar 21, 1926  Today's Date: 04/20/2022  Modified Barium Swallow completed.  Full report located under Chart Review in the Imaging Section.  History of Present Illness Patient is a 89 y.o. female with PMH: dementia, a-fib, chronic diastolic CHF, HLD, hypothyroidism, HTN, polyneuropathy, CKD 3b, h/o UTI, hiatal hernia. She presented to [facility] ED on 04/17/22 due to concern for recurrent aspiration. Per family report, she choked on  her lunch meal and has had a similar episode in the past. GI performed urgent EGD and found and removed food which was impacted throughout esophagus and dilated patient's distal esophagus. She was made NPO until SLP swallow evaluation.   Clinical Impression Patient presents with mild oropharyngeal dysphagia with suspected cognitive impact. She has a known esophageal dysphagia that GI has assessed. No penetration or aspiration observed on this MBS with any of the tested barium consistencies (thin, nectar and honey thick, puree, mechanical soft solid). During oral phase, she exhibited prolonged mastication of solids. She was not able to complete a oral hold secondary to dementia, however did appear to have reduced oral containment of liquids. Thin and nectar thick liquids transited into vallecular and then pyriform sinus before swallow was initiated and with honey thick liquids, puree solids and mechanical soft solids, head of bolus was exiting vallecular sinus when swallow initiated. Partial laryngeal elevation and partial anterior hyoid excursion observed, however patient with complete inversion of epiglottis and laryngeal vestibule closure was timely and complete. SLP suspects combination of reduced pharyngeal sensation and cognitive impairment did contribute to dysphagia, with incident of patient talking while mechanical soft bolus was in vallecular sinus and requiring cue to swallow.  No pharyngeal residuals observed post swallows, no stasis of barium or retrograde movement of barium at or below PES. Esophageal sweep did reveal appearance of stasis of barium in distal esophagus but no barium observed in upper esophagus. SLP recommending advance diet to Dys 3 solids, thin liquids. GI also is recommending that patient have soft diet indefinitely. Factors that may increase risk of adverse event in presence of aspiration [employer] Clearance Coots 2021): Reduced cognitive function;Frail or deconditioned  Swallow Evaluation Recommendations Recommendations: PO diet PO Diet Recommendation: Dysphagia 3 (Mechanical soft);Thin liquids (Level 0) Liquid Administration via: Cup;Straw Medication Administration: Crushed with puree Supervision: Full supervision/cueing for swallowing strategies;Patient able to self-feed Swallowing strategies  : Minimize environmental distractions;Slow rate;Small bites/sips Postural changes: Stay upright 30-60 min after meals;Position pt fully upright for meals Oral care recommendations: Oral care BID (2x/day)   Angela Nevin, MA, CCC-SLP Speech Therapy

## 2022-04-20 NOTE — Progress Notes (Signed)
Obtained order for & administered fleets enema. Pt has been struggling all afternoon to have BM. Currently on [facility] for 2nd time. Pt extremely uncomfortable, but also unable to concentrate even with instructions to push. Pt was able to pass a few more very hard round rock style pieces of stool, when sitting.

## 2022-04-20 NOTE — Progress Notes (Addendum)
PROGRESS NOTE    Grace Owens  KRC:[id number] DOB: Jul 15, 1926 DOA: 04/17/2022 PCP: Ezequiel Kayser, MD    Brief Narrative:  Grace Owens is a 89 y.o. female with medical history significant for dementia, paroxysmal A-fib on Eliquis, chronic diastolic CHF, hyperlipidemia, hypothyroidism, hypertension, polyneuropathy, CKD 3B, history of urinary tract infection on prophylactic Keflex and on daily probiotics, hiatal hernia, who presented to [facility] ED with concern for recurrent aspiration.  During her lunch she choked up on her meal per her family account.  She has had a similar episode in the past but this episode lasted longer.  Patient's family brought her into the ED for further evaluation. -s/p EGD   Assessment and Plan: Aspiration into airway, rule out food impaction, esophageal stricture. Chest x-ray nonacute, hiatal hernia seen on chest x-ray. Seen by GI, s/p EGD: 04/17/22-  Food in the esophagus. Removal was successful.  Dilation in the distal esophagus. SLP eval-- DYS 3 diet Aspiration precaution in place.   Hypothyroidism Resume home levothyroxine    Recurrent UTI, on prophylactic Keflex Resume home Keflex -family concerned about UTI-- will check UA-- will be difficult to distinguish colonization vs acute infection on culture--- no WBC count and no fever   Hypertension Resume home oral antihypertensives  -add norvasc   Hyperlipidemia Resume home regimen   Chronic lumbar stenosis/polyneuropathy Resume home gabapentin    Dementia Reorient as needed -delirium precautions  -urinalysis to r/o UTI   CKD 3B At baseline   H/o p a fib -on bisprolol and eliquis  Anemia- no sign of bleeding -monitor- CBC pending   DVT prophylaxis: apixaban (ELIQUIS) tablet 2.5 mg Start: 04/18/22 1000 apixaban (ELIQUIS) tablet 2.5 mg    Code Status: DNR Family Communication: called daughter 4/7 and 4/8  Disposition Plan:  Level of care: Telemetry Status is: inpt  Consultants:   GI   Subjective: Says she does not feel like herself  Objective: Vitals:   04/20/22 0600 04/20/22 0630 04/20/22 0700 04/20/22 0800  BP:  (!) 178/90    Pulse:  64    Resp: 16 16 17 17   Temp:  98 F (36.7 C)    TempSrc:  Oral    SpO2:  100%    Weight:      Height:        Intake/Output Summary (Last 24 hours) at 04/20/2022 4360 Last data filed at 04/20/2022 0915 Gross per 24 hour  Intake 598 ml  Output --  Net 598 ml   Filed Weights   04/17/22 1954 04/18/22 0250 04/19/22 0500  Weight: 63.5 kg 72 kg 72.1 kg    Examination:    General: Appearance:     Overweight female in no acute distress     Lungs:     respirations unlabored  Heart:    Normal heart rate.   MS:   All extremities are intact.   Neurologic:   Awake, pleasantly confused         Data Reviewed: I have personally reviewed following labs and imaging studies  CBC: Recent Labs  Lab 04/17/22 1902 04/18/22 0503 04/19/22 0406  WBC 5.5 7.7 4.7  NEUTROABS 3.6  --   --   HGB 13.2 11.7* 10.0*  HCT 41.5 36.8 31.2*  MCV 101.0* 101.1* 100.3*  PLT 234 215 186   Basic Metabolic Panel: Recent Labs  Lab 04/17/22 1902 04/18/22 0503 04/19/22 0406  NA 137 141 140  K 4.6 3.7 4.0  CL 102 106 108  CO2 25 27 27  GLUCOSE 110* 108* 112*  BUN 34* 30* 24*  CREATININE 1.25* 1.22* 1.23*  CALCIUM 9.5 9.1 8.6*  MG  --  2.0  --   PHOS  --  3.4  --    GFR: Estimated Creatinine Clearance: 29.1 mL/min (A) (by C-G formula based on SCr of 1.23 mg/dL (H)). Liver Function Tests: Recent Labs  Lab 04/17/22 1902  AST 33  ALT 19  ALKPHOS 51  BILITOT 0.8  PROT 7.4  ALBUMIN 4.2   No results for input(s): "LIPASE", "AMYLASE" in the last 168 hours. No results for input(s): "AMMONIA" in the last 168 hours. Coagulation Profile: No results for input(s): "INR", "PROTIME" in the last 168 hours. Cardiac Enzymes: No results for input(s): "CKTOTAL", "CKMB", "CKMBINDEX", "TROPONINI" in the last 168 hours. BNP (last 3  results) No results for input(s): "PROBNP" in the last 8760 hours. HbA1C: No results for input(s): "HGBA1C" in the last 72 hours. CBG: Recent Labs  Lab 04/17/22 2156  GLUCAP 110*   Lipid Profile: No results for input(s): "CHOL", "HDL", "LDLCALC", "TRIG", "CHOLHDL", "LDLDIRECT" in the last 72 hours. Thyroid Function Tests: No results for input(s): "TSH", "T4TOTAL", "FREET4", "T3FREE", "THYROIDAB" in the last 72 hours. Anemia Panel: No results for input(s): "VITAMINB12", "FOLATE", "FERRITIN", "TIBC", "IRON", "RETICCTPCT" in the last 72 hours. Sepsis Labs: Recent Labs  Lab 04/17/22 1913  LATICACIDVEN 1.5    No results found for this or any previous visit (from the past 240 hour(s)).       Radiology Studies: No results found.      Scheduled Meds:  acetaminophen  1,000 mg Oral BID   amLODipine  5 mg Oral Daily   apixaban  2.5 mg Oral BID   benazepril  20 mg Oral BID   bisoprolol  5 mg Oral Daily   cephALEXin  250 mg Oral Daily   cyanocobalamin  1,000 mcg Oral Daily   diclofenac Sodium  2 g Topical QID   gabapentin  300 mg Oral QHS   levothyroxine  100 mcg Oral Q0600   multivitamin with minerals  1 tablet Oral QHS   pantoprazole (PROTONIX) IV  40 mg Intravenous Q24H   saccharomyces boulardii  500 mg Oral BID   simvastatin  20 mg Oral QPM   Continuous Infusions:     LOS: 2 days    Time spent: 45 minutes spent on chart review, discussion with nursing staff, consultants, updating family and interview/physical exam; more than 50% of that time was spent in counseling and/or coordination of care.    Joseph Art, DO [facility] Available via Epic secure chat 7am-7pm After these hours, please refer to coverage provider listed on [email or web address] 04/20/2022, 9:37 AM

## 2022-04-20 NOTE — TOC Progression Note (Signed)
Transition of Care [facility]) - Progression Note    Patient Details  Name: Grace Owens MRN: [id number] Date of Birth: [birth year]  [facility]) CM/SW Contact  Larrie Kass, LCSW Phone Number: 04/20/2022, 2:54 PM  Clinical Narrative:     CSW received a call from pt's daughter she stated she would like to take pt home with home health. They have declined SNF placement. Pt's daughter stated pt will be moving to [place] with and have chosen [facility] for pt's home health agency. CSW spoke to Thayer with [facility], he reported needed home health orders faxed. MD made aware. TOC to follow.  Expected Discharge Plan:  (TBD) Barriers to Discharge: Continued Medical Work up  Expected Discharge Plan and Services       Living arrangements for the past 2 months: Single Family Home                                       Social Determinants of Health (SDOH) Interventions SDOH Screenings   Food Insecurity: Patient Unable To Answer (04/19/2022)  Housing: Low Risk  (04/18/2022)  Transportation Needs: Patient Unable To Answer (04/19/2022)  Utilities: Patient Unable To Answer (04/19/2022)  Tobacco Use: Low Risk  (04/17/2022)    Readmission Risk Interventions     No data to display

## 2022-04-20 NOTE — Progress Notes (Signed)
Speech Language Pathology Treatment: Dysphagia  Patient Details Name: Grace Owens MRN: [id number] DOB: 09/26/1926 Today's Date: 04/20/2022 Time: 5631-4970 SLP Time Calculation (min) (ACUTE ONLY): 10 min  Assessment / Plan / Recommendation Clinical Impression  SLP returned to patient's room to drop off educational information describing mechanical soft solids diet. Patient was sitting in recliner and her daughter in law was in the room. SLP gave daughter in law the diet texture information and provided her with verbal education, recommendations and brief discussion of swallow study results from today. In addition to soft solids diet, SLP recommended that patient follow esophageal dysphagia precautions including: sitting upright during and for at least 30-45 minutes after meals, avoiding foods that are very dry or tough, avoiding eating meals within two hours before bedtime and considering elevating HOB. SLP plans to attempt one more f/u to ensure diet toleration.    HPI HPI: Patient is a 89 y.o. female with PMH: dementia, a-fib, chronic diastolic CHF, HLD, hypothyroidism, HTN, polyneuropathy, CKD 3b, h/o UTI, hiatal hernia. She presented to [facility] ED on 04/17/22 due to concern for recurrent aspiration. Per family report, she choked on  her lunch meal and has had a similar episode in the past. GI performed urgent EGD and found and removed food which was impacted throughout esophagus and dilated patient's distal esophagus. She was made NPO until SLP swallow evaluation.      SLP Plan  Continue with current plan of care      Recommendations for follow up therapy are one component of a multi-disciplinary discharge planning process, led by the attending physician.  Recommendations may be updated based on patient status, additional functional criteria and insurance authorization.    Recommendations  Diet recommendations: Dysphagia 3 (mechanical soft);Thin liquid Liquids provided via:  Cup;Straw Medication Administration: Crushed with puree Supervision: Full supervision/cueing for compensatory strategies Compensations: Small sips/bites;Slow rate Postural Changes and/or Swallow Maneuvers: Seated upright 90 degrees;Upright 30-60 min after meal                  Oral care BID   Frequent or constant Supervision/Assistance Dysphagia, oropharyngeal phase (R13.12)     Continue with current plan of care   Angela Nevin, MA, CCC-SLP Speech Therapy

## 2022-04-21 ENCOUNTER — Encounter (HOSPITAL_COMMUNITY): Payer: Self-pay | Admitting: Gastroenterology

## 2022-04-21 DIAGNOSIS — T18128A Food in esophagus causing other injury, initial encounter: Secondary | ICD-10-CM | POA: Diagnosis not present

## 2022-04-21 DIAGNOSIS — W44F3XA Food entering into or through a natural orifice, initial encounter: Secondary | ICD-10-CM | POA: Diagnosis not present

## 2022-04-21 LAB — URINALYSIS, ROUTINE W REFLEX MICROSCOPIC
Bilirubin Urine: NEGATIVE
Glucose, UA: NEGATIVE mg/dL
Hgb urine dipstick: NEGATIVE
Ketones, ur: NEGATIVE mg/dL
Nitrite: NEGATIVE
Protein, ur: NEGATIVE mg/dL
Specific Gravity, Urine: 1.012 (ref 1.005–1.030)
pH: 6 (ref 5.0–8.0)

## 2022-04-21 MED ORDER — SORBITOL 70 % SOLN
960.0000 mL | TOPICAL_OIL | Freq: Once | ORAL | Status: AC
  Administered 2022-04-21: 960 mL via RECTAL
  Filled 2022-04-21: qty 240

## 2022-04-21 MED ORDER — OMEPRAZOLE 2 MG/ML ORAL SUSPENSION: 40.0000 mg | Freq: Every day | ORAL | 1 refills | Status: DC

## 2022-04-21 MED ORDER — PANTOPRAZOLE SODIUM 40 MG PO TBEC: 40.0000 mg | DELAYED_RELEASE_TABLET | Freq: Every day | ORAL | 1 refills | Status: DC

## 2022-04-21 MED ORDER — LANSOPRAZOLE 15 MG PO TBDD: 30.0000 mg | DELAYED_RELEASE_TABLET | Freq: Every day | ORAL | Status: AC

## 2022-04-21 MED ORDER — AMLODIPINE BESYLATE 5 MG PO TABS: 5.0000 mg | ORAL_TABLET | Freq: Every day | ORAL | 0 refills | Status: AC

## 2022-04-21 MED ORDER — BISACODYL 10 MG RE SUPP: 10.0000 mg | Freq: Every day | RECTAL | 0 refills | Status: AC | PRN

## 2022-04-21 NOTE — Plan of Care (Signed)

## 2022-04-21 NOTE — TOC Transition Note (Signed)
Transition of Care [facility]) - CM/SW Discharge Note   Patient Details  Name: Grace Owens MRN: [id number] Date of Birth: [birth year]  Transition of Care [facility]) CM/SW Contact:  Larrie Kass, LCSW Phone Number: 04/21/2022, 10:38 AM   Clinical Narrative:     Pt's HH orders has been emailed to [facility]. Family will provided transport at D/c. No additional needs. TOC sign off.    Barriers to Discharge: Continued Medical Work up   Patient Goals and CMS Choice      Discharge Placement                         Discharge Plan and Services Additional resources added to the After Visit Summary for                                       Social Determinants of Health (SDOH) Interventions SDOH Screenings   Food Insecurity: Patient Unable To Answer (04/19/2022)  Housing: Low Risk  (04/18/2022)  Transportation Needs: Patient Unable To Answer (04/19/2022)  Utilities: Patient Unable To Answer (04/19/2022)  Tobacco Use: Low Risk  (04/21/2022)     Readmission Risk Interventions     No data to display

## 2022-04-21 NOTE — Discharge Instructions (Addendum)
Medication Administration: Crushed with puree -- can get chewable preservision  lansoprazole orally disintegrating tablets--- will be cheaper to get it over the counter because medicare won't cover-- over the counter strength is 15mg , so would recommend taking 2 x 15mg  ODT tablets daily

## 2022-04-21 NOTE — Progress Notes (Addendum)
Speech Language Pathology Treatment: Dysphagia  Patient Details Name: Grace Owens MRN: [id number] DOB: 1927-01-03 Today's Date: 04/21/2022 Time: 4166-0630 SLP Time Calculation (min) (ACUTE ONLY): 22 min  Assessment / Plan / Recommendation Clinical Impression  SLP facilitated session re: dysphagia goals by helping to get her set up and feeding her lunch. Provided her with broccoli, mashed potatoes, and beef chopped, as well as sweet tea. There were no indications of oropharyngeal dysphagia and patient was fed at a slow rate. She helped to hold her cup and drink from a straw to facilitate proprioception for maximal swallow safety. Patient consumed approximately 10% of her lunch before she advised she was finished. Provided her with liquids in hopes to help facilitate esophageal clearance.   Per MD request telephone Sarah, patient's daughter, and answered several of her questions. Relayed that patient is to stay on a mechanical soft diet long-term per GI and reviewed parameters for diet. In addition advised patient full supervision at least initially when eating to assure tolerance. Sitter inquired regarding wedge for patient to use for sleep and given patient's history of reflux SLP concurs to its advantage. Discussed gustatory changes with dementia including sweet being last taste sensation intact. Regarding medications, SLP advised if they are small to consider given to patient with liquids however with pills are large and is not contraindicated advised to crush them. Discussed antichoking device called "Life Vac" that has been utilized for some people. SLP recommends to consider it given patient's advanced age and her suboptimal positioning that may lessen effect and increase negative ramifications of Heimlich maneuver performance. Advised consider small frequent meals throughout the day and to continue PPI as MD orders.   Patient's largest aspiration risk is due to her hiatal hernia esophageal  tortuosity, suboptimal position for p.o., etc.. In this SLP's opinion no OP follow-up indicated as all education has been completed with daughter regarding patient's dysphagia management.     HPI   Patient is a 89 y.o. female with PMH: dementia, a-fib, chronic diastolic CHF, HLD, hypothyroidism, HTN, polyneuropathy, CKD 3b, h/o UTI, hiatal hernia. She presented to [facility] ED on 04/17/22 due to concern for recurrent aspiration. Per family report, she choked on her lunch meal and has had a similar episode in the past. GI performed urgent EGD and found and removed food which was impacted throughout esophagus and dilated patient's distal esophagus. She was made NPO until SLP swallow evaluation.      SLP Plan  Continue with current plan of care      Recommendations for follow up therapy are one component of a multi-disciplinary discharge planning process, led by the attending physician.  Recommendations may be updated based on patient status, additional functional criteria and insurance authorization.    Recommendations  Diet recommendations: Dysphagia 3 (mechanical soft);Thin liquid Liquids provided via: Cup;Straw Medication Administration: Crushed with puree (small pills whole -) Supervision: Full supervision/cueing for compensatory strategies Compensations: Small sips/bites;Slow rate;Other (Comment) (start intake with liquids) Postural Changes and/or Swallow Maneuvers: Seated upright 90 degrees;Upright 30-60 min after meal                  Oral care BID   Frequent or constant Supervision/Assistance Dysphagia, oropharyngeal phase (R13.12)    Pt to dc, all goals met   Rolena Infante, MS [facility] SLP Acute Rehab Services Office [phone]   Chales Abrahams  04/21/2022, 2:56 PM

## 2022-04-21 NOTE — Progress Notes (Addendum)
Physical Therapy Treatment Patient Details Name: Grace Owens MRN: [id number] DOB: 08/10/1926 Today's Date: 04/21/2022   History of Present Illness Pt is a 89 yo female presenting to [facility] ED on 4/5 with concern for recurrent aspiration; s/p EGD with food in esophagus removed during procedure.  PMH: dmentia, PAF, CHF, HLD, hypothyroidism, HTN, polyneuropathy, CKD3, hx of MI, hx of UTI, hiatal hernia.    PT Comments    Patient received in bed eating desert, min assist to finish feeding due to difficulty with fork. Pt agreeable to therapy and Mod assist provided for supine>sit EOB. Bed pad used to scoot hips to edge in preparation for sit<>stand. Disposable scrubs donned with Total Assist while sitting EOB. Pt had BM on standing with RW and RN arrived to assist with pericare. Mod assist needed to power up to RW and take small steps for step pivot transfer bed>chair. Pt completed seated LE exercises and repositioned EOS with LE's elevated , Alarm on and call bell within reach.     Recommendations for follow up therapy are one component of a multi-disciplinary discharge planning process, led by the attending physician.  Recommendations may be updated based on patient status, additional functional criteria and insurance authorization.  Follow Up Recommendations  Can patient physically be transported by private vehicle: No    Assistance Recommended at Discharge Frequent or constant Supervision/Assistance  Patient can return home with the following A lot of help with walking and/or transfers;A lot of help with bathing/dressing/bathroom;Assistance with cooking/housework;Direct supervision/assist for financial management;Assistance with feeding;Direct supervision/assist for medications management;Assist for transportation;Help with stairs or ramp for entrance   Equipment Recommendations   (TBD)    Recommendations for Other Services       Precautions / Restrictions Precautions Precautions:  Fall Restrictions Weight Bearing Restrictions: No     Mobility  Bed Mobility Overal bed mobility: Needs Assistance Bed Mobility: Supine to Sit     Supine to sit: Mod assist, HOB elevated     General bed mobility comments: Mod assist with HOB elevated and step by step cues to bring LE's off EOB and grab bed rail with Lt hand to raise trunk. Bed pad used to scoot hips towards EOB to place feet on floor.    Transfers Overall transfer level: Needs assistance Equipment used: Rolling walker (2 wheels) Transfers: Sit to/from Stand, Bed to chair/wheelchair/BSC Sit to Stand: Mod assist   Step pivot transfers: Min assist       General transfer comment: Mod Assist with EOB elevated to improve power up due to limited ROM of UE's to reach walker. Pt required mod assist to complete rise and prevent posterior LOB due to strong posterior lean with initial stand. Pt soiled from BM and RN arrived to assist with pericare. Mod assist to sequence small steps bed>chair with RW. pt had difficulty stepping Rt LE>Lt LE.    Ambulation/Gait                   Stairs             Wheelchair Mobility    Modified Rankin (Stroke Patients Only)       Balance Overall balance assessment: Needs assistance Sitting-balance support: Feet supported, No upper extremity supported Sitting balance-Leahy Scale: Good     Standing balance support: Reliant on assistive device for balance, During functional activity, Bilateral upper extremity supported Standing balance-Leahy Scale: Fair  Cognition Arousal/Alertness: Awake/alert Behavior During Therapy: WFL for tasks assessed/performed Overall Cognitive Status: No family/caregiver present to determine baseline cognitive functioning                                          Exercises  1x 10 reps LAQ bil LE    General Comments        Pertinent Vitals/Pain Pain Assessment Pain  Assessment: No/denies pain    Home Living                          Prior Function            PT Goals (current goals can now be found in the care plan section) Acute Rehab PT Goals Patient Stated Goal: unable to state PT Goal Formulation: Patient unable to participate in goal setting Time For Goal Achievement: 05/02/22 Potential to Achieve Goals: Fair Progress towards PT goals: Progressing toward goals    Frequency    Min 3X/week      PT Plan Current plan remains appropriate    Co-evaluation              AM-PAC PT "6 Clicks" Mobility   Outcome Measure  Help needed turning from your back to your side while in a flat bed without using bedrails?: A Lot Help needed moving from lying on your back to sitting on the side of a flat bed without using bedrails?: A Lot Help needed moving to and from a bed to a chair (including a wheelchair)?: A Lot Help needed standing up from a chair using your arms (e.g., wheelchair or bedside chair)?: A Lot Help needed to walk in hospital room?: A Lot Help needed climbing 3-5 steps with a railing? : Total 6 Click Score: 11    End of Session Equipment Utilized During Treatment: Gait belt Activity Tolerance: Patient tolerated treatment well;No increased pain Patient left: in chair;with call bell/phone within reach;with chair alarm set Nurse Communication: Mobility status PT Visit Diagnosis: Unsteadiness on feet (R26.81);Difficulty in walking, not elsewhere classified (R26.2)     Time: 5188-4166 PT Time Calculation (min) (ACUTE ONLY): 24 min  Charges:  $Therapeutic Activity: 23-37 mins                     Wynn Maudlin, DPT Acute Rehabilitation Services Office [phone]  04/21/22 4:27 PM

## 2022-04-21 NOTE — Discharge Summary (Addendum)
Physician Discharge Summary  Grace RamRosemary Tabron ZOX:[id number] DOB: 10/22/1926 DOA: 04/17/2022  PCP: Ezequiel KayserBright, David K, MD  Admit date: 04/17/2022 Discharge date: 04/21/2022  Admitted From: home Discharge disposition: home   Recommendations for Outpatient Follow-Up:   Home health OTC PPI Outpatient GI follow up   Discharge Diagnosis:   Principal Problem:   Aspiration into airway Active Problems:   Food impaction of esophagus   Stricture esophagus    Discharge Condition: Improved.  Diet recommendation: DYS 3 diet  Wound care: None.  Code status: DNR   History of Present Illness:   Grace Owens is a 89 y.o. female with medical history significant for dementia, paroxysmal A-fib on Eliquis, chronic diastolic CHF, hyperlipidemia, hypothyroidism, hypertension, polyneuropathy, CKD 3B, history of urinary tract infection on prophylactic Keflex and on daily probiotics, hiatal hernia, who [facility] with concern for recurrent aspiration.  During her lunch she choked up on her meal per her family account.  She has had a similar episode in the past but this episode lasted longer.  Patient's family brought her into the ED for further evaluation.   EDP discussed the case with GI on-call, Dr. Bosie ClosSchooler, plan for EGD tonight.  [facility], hospitalist service, was asked to admit for observation.   The patient was seen and examined at her bedside after her EGD.  She is pleasantly confused.  She has no complaints.   Hospital Course by Problem:   Aspiration into airway, rule out food impaction, esophageal stricture. Chest x-ray nonacute, hiatal hernia seen on chest x-ray. Seen by GI, s/p EGD: 04/17/22-  Food in the esophagus. Removal was successful.  Dilation in the distal esophagus. SLP eval-- DYS 3 diet Aspiration precaution in place.     Hypothyroidism Resume home levothyroxine    Recurrent UTI, on prophylactic Keflex Resume home Keflex -does not appear to have active  infection   Hypertension Resume home oral antihypertensives  -add norvasc   Hyperlipidemia Resume home regimen   Chronic lumbar stenosis/polyneuropathy Resume home gabapentin    Dementia Reorient as needed -delirium precautions  -seems to be back to baseline   CKD 3B At baseline     H/o p a fib -on bisprolol and eliquis   Anemia- -resolved    Medical Consultants:    SLP  Discharge Exam:   Vitals:   04/21/22 0417 04/21/22 1253  BP: (!) 146/56 (!) 112/51  Pulse: 64 64  Resp: 18 15  Temp: 97.9 F (36.6 C) 98.6 F (37 C)  SpO2: 97% 98%   Vitals:   04/20/22 1623 04/20/22 2110 04/21/22 0417 04/21/22 1253  BP: (!) 196/169 (!) 147/76 (!) 146/56 (!) 112/51  Pulse: 66 73 64 64  Resp: 18 18 18 15   Temp: 98.2 F (36.8 C) 98.4 F (36.9 C) 97.9 F (36.6 C) 98.6 F (37 C)  TempSrc: Oral Oral Oral Oral  SpO2: 100% 97% 97% 98%  Weight:      Height:        General exam: Appears calm and comfortable.     The results of significant diagnostics from this hospitalization (including imaging, microbiology, ancillary and laboratory) are listed below for reference.     Procedures and Diagnostic Studies:   DG Chest 2 View  Result Date: 04/17/2022 CLINICAL DATA:  Possible aspiration EXAM: CHEST - 2 VIEW COMPARISON:  10/14/2018 FINDINGS: Moderate-sized hiatal hernia. Heart and mediastinal contours are within normal limits. No focal opacities or effusions. No acute bony abnormality. IMPRESSION: Hiatal hernia.  No active cardiopulmonary disease. Electronically Signed   By: Charlett Nose M.D.   On: 04/17/2022 17:05     Labs:   Basic Metabolic Panel: Recent Labs  Lab 04/17/22 1902 04/18/22 0503 04/19/22 0406  NA 137 141 140  K 4.6 3.7 4.0  CL 102 106 108  CO2 25 27 27   GLUCOSE 110* 108* 112*  BUN 34* 30* 24*  CREATININE 1.25* 1.22* 1.23*  CALCIUM 9.5 9.1 8.6*  MG  --  2.0  --   PHOS  --  3.4  --    GFR Estimated Creatinine Clearance: 29.1 mL/min (A) (by C-G  formula based on SCr of 1.23 mg/dL (H)). Liver Function Tests: Recent Labs  Lab 04/17/22 1902  AST 33  ALT 19  ALKPHOS 51  BILITOT 0.8  PROT 7.4  ALBUMIN 4.2   No results for input(s): "LIPASE", "AMYLASE" in the last 168 hours. No results for input(s): "AMMONIA" in the last 168 hours. Coagulation profile No results for input(s): "INR", "PROTIME" in the last 168 hours.  CBC: Recent Labs  Lab 04/17/22 1902 04/18/22 0503 04/19/22 0406 04/20/22 0845  WBC 5.5 7.7 4.7 8.1  NEUTROABS 3.6  --   --   --   HGB 13.2 11.7* 10.0* 12.9  HCT 41.5 36.8 31.2* 40.1  MCV 101.0* 101.1* 100.3* 99.5  PLT 234 215 186 232   Cardiac Enzymes: No results for input(s): "CKTOTAL", "CKMB", "CKMBINDEX", "TROPONINI" in the last 168 hours. BNP: Invalid input(s): "POCBNP" CBG: Recent Labs  Lab 04/17/22 2156  GLUCAP 110*   D-Dimer No results for input(s): "DDIMER" in the last 72 hours. Hgb A1c No results for input(s): "HGBA1C" in the last 72 hours. Lipid Profile No results for input(s): "CHOL", "HDL", "LDLCALC", "TRIG", "CHOLHDL", "LDLDIRECT" in the last 72 hours. Thyroid function studies No results for input(s): "TSH", "T4TOTAL", "T3FREE", "THYROIDAB" in the last 72 hours.  Invalid input(s): "FREET3" Anemia work up No results for input(s): "VITAMINB12", "FOLATE", "FERRITIN", "TIBC", "IRON", "RETICCTPCT" in the last 72 hours. Microbiology No results found for this or any previous visit (from the past 240 hour(s)).   Discharge Instructions:   Discharge Instructions     Discharge instructions   Complete by: As directed    DYS 3: Seated upright 90 degrees;Upright 30-60 min after meal Outpatient follow up with GI   Increase activity slowly   Complete by: As directed       Allergies as of 04/21/2022       Reactions   Celebrex [celecoxib]         Medication List     STOP taking these medications    Myrbetriq 25 MG Tb24 tablet Generic drug: mirabegron ER       TAKE these  medications    acetaminophen 500 MG tablet Commonly known as: TYLENOL Take 1,000 mg by mouth 2 (two) times daily. 1000mg  in AM, then 1000mg  8hrs later   amLODipine 5 MG tablet Commonly known as: NORVASC Take 1 tablet (5 mg total) by mouth daily. Start taking on: April 22, 2022   benazepril 20 MG tablet Commonly known as: LOTENSIN Take 20 mg by mouth 2 (two) times daily.   bisacodyl 10 MG suppository Commonly known as: DULCOLAX Place 1 suppository (10 mg total) rectally daily as needed for moderate constipation.   bisoprolol 5 MG tablet Commonly known as: ZEBETA Take 5 mg by mouth daily.   cephALEXin 250 MG capsule Commonly known as: KEFLEX Take 250 mg by mouth daily.   CoQ10  100 MG Caps Take 1 tablet by mouth daily.   cyanocobalamin 1000 MCG tablet Commonly known as: VITAMIN B12 Take 1,000 mcg by mouth daily.   diclofenac sodium 1 % Gel Commonly known as: VOLTAREN Apply 2 g topically in the morning and at bedtime. L knee   Eliquis 2.5 MG Tabs tablet Generic drug: apixaban Take 2.5 mg by mouth 2 (two) times daily.   gabapentin 300 MG capsule Commonly known as: NEURONTIN Take 300 mg by mouth at bedtime.   lansoprazole 15 MG disintegrating tablet Commonly known as: PREVACID SOLUTAB Take 2 tablets (30 mg total) by mouth daily at 12 noon.   levothyroxine 100 MCG tablet Commonly known as: SYNTHROID Take 100 mcg by mouth daily before breakfast.   METAMUCIL PO Take 15 tablets by mouth daily in the afternoon. Take 2 hours apart from all other medications   multivitamin with minerals Tabs tablet Take 1 tablet by mouth at bedtime.   Osteo Bi-Flex Regular Strength 250-200 MG Tabs Generic drug: Glucosamine-Chondroitin Take 1 tablet by mouth in the morning and at bedtime.   OVER THE COUNTER MEDICATION Take 1 tablet by mouth every evening. Medication: Cinnamon Bark   phenazopyridine 95 MG tablet Commonly known as: PYRIDIUM Take 190 mg by mouth daily.    PRESERVISION AREDS 2 PO Take 1 tablet by mouth in the morning and at bedtime.   saccharomyces boulardii 250 MG capsule Commonly known as: FLORASTOR Take 500 mg by mouth 2 (two) times daily.   simvastatin 20 MG tablet Commonly known as: ZOCOR Take 20 mg by mouth every evening.   SYSTANE FREE OP Place 1 drop into both eyes 4 (four) times daily.   VIACTIV PO Take 1 tablet by mouth in the morning and at bedtime.        Follow-up Information     Bright, Cletis [place], MD Follow up in 1 week(s).   Specialty: Internal Medicine Contact information: [address] [place] [address] [phone]                  Time coordinating discharge: 45 min  Signed:  Joseph Art DO  [facility] 04/21/2022, 1:20 PM

## 2022-04-21 NOTE — Care Management Important Message (Signed)
Important Message  Patient Details IM Letter given to the Patient's Daughter. Name: Grace Owens MRN: [id number] Date of Birth: [birth year]   Medicare Important Message Given:  Yes     Caren Macadam 04/21/2022, 10:25 AM

## 2022-11-23 IMAGING — MR MR LUMBAR SPINE W/O CM
4 of 5 series · 18 of 48 positions shown · non-contrast
Comparison: None available.

CLINICAL DATA: [AGE] female with persistent right side low
back pain radiating to the hip. No known injury. Prior surgery.

EXAM:
MRI LUMBAR SPINE WITHOUT CONTRAST
TECHNIQUE: Multiplanar, multisequence MR imaging of the lumbar spine was
performed. No intravenous contrast was administered.

[Series 6: T2 · sagittal · 4.0mm · 0.73mm/px · 6 of 18 slices shown (1 of 2)]
[im 1/18]
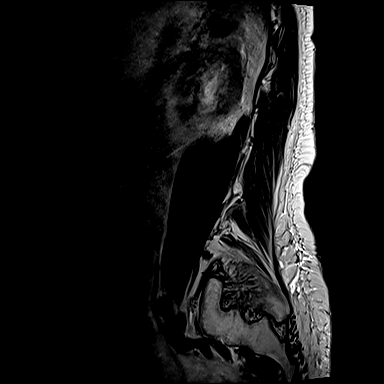
[im 4/18]
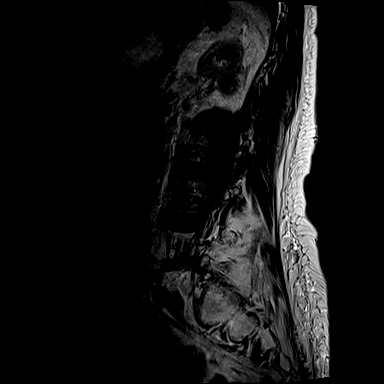
[im 7/18]
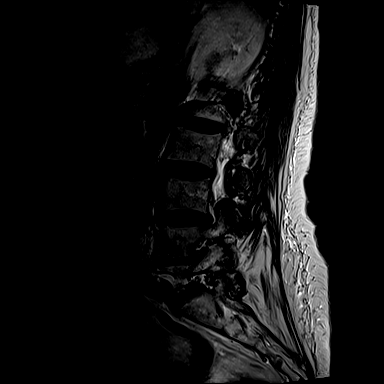
[im 11/18]
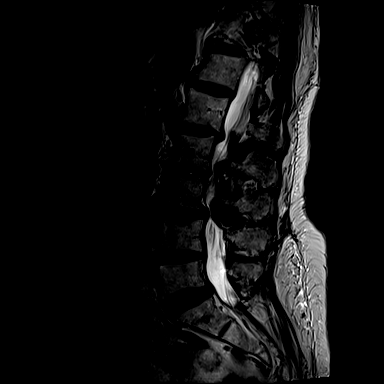
[im 14/18]
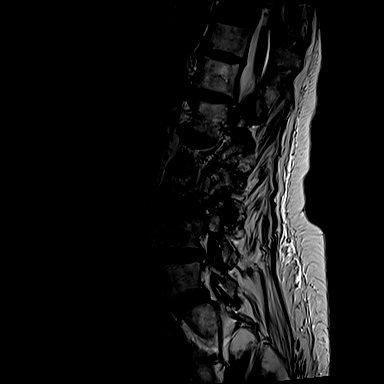
[im 18/18]
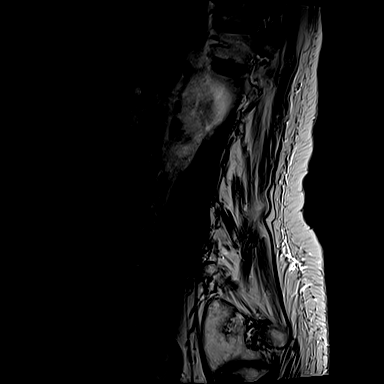

[Series 7: T1 · sagittal · 4.0mm · 0.73mm/px · 3 of 18 slices shown (1 of 2)]
[im 3/18]
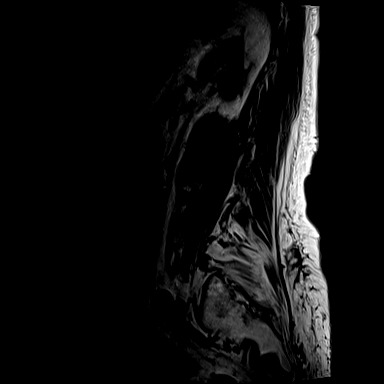
[im 9/18]
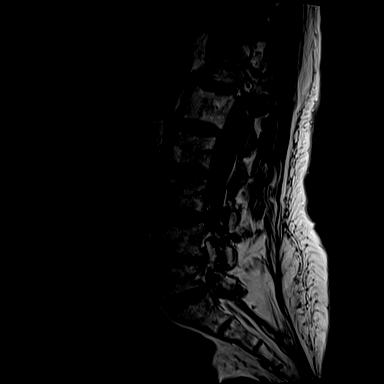
[im 15/18]
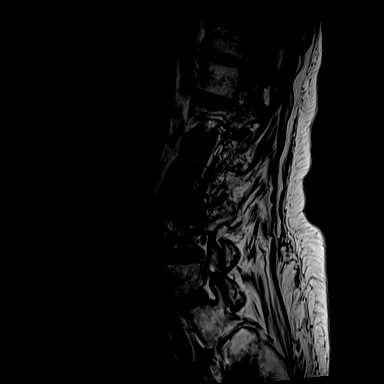

[Series 12: T2 · axial · 4.0mm · 0.28mm/px · z∈[-29,+143]mm · 6 of 39 slices shown (2 of 2)]
[im 1/39]
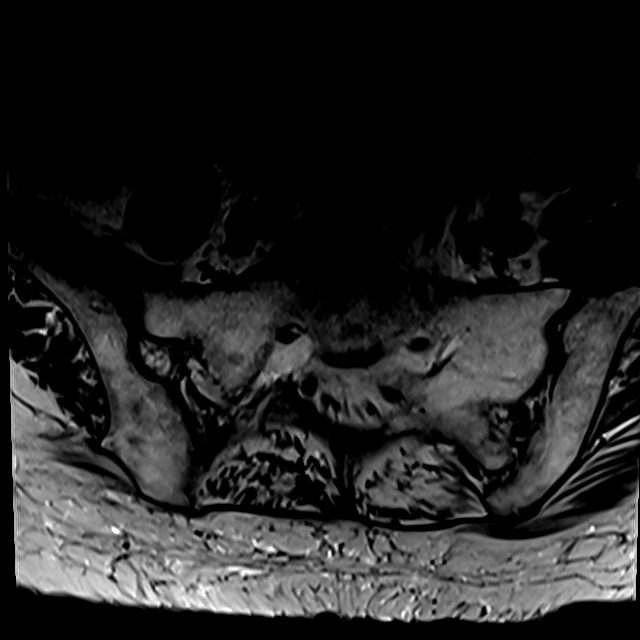
[im 6/39]
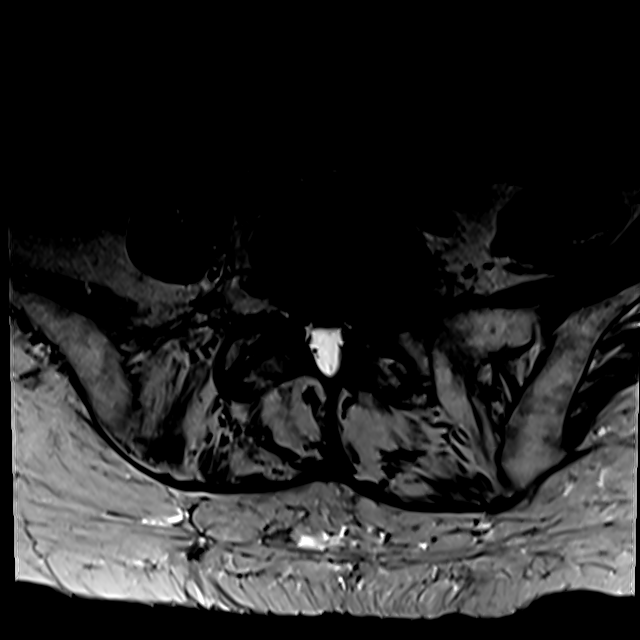
[im 12/39]
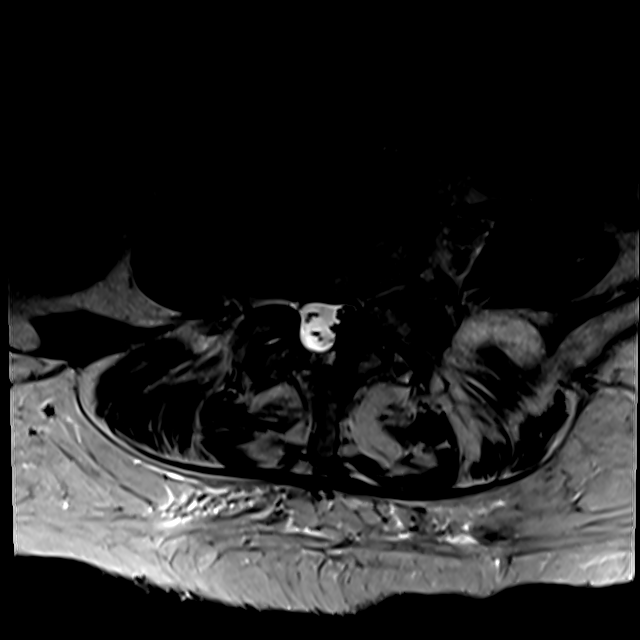
[im 18/39]
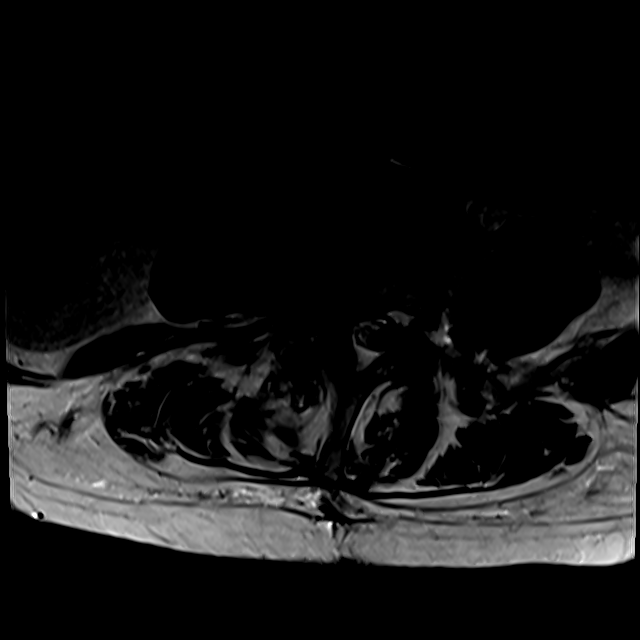
[im 21/39]
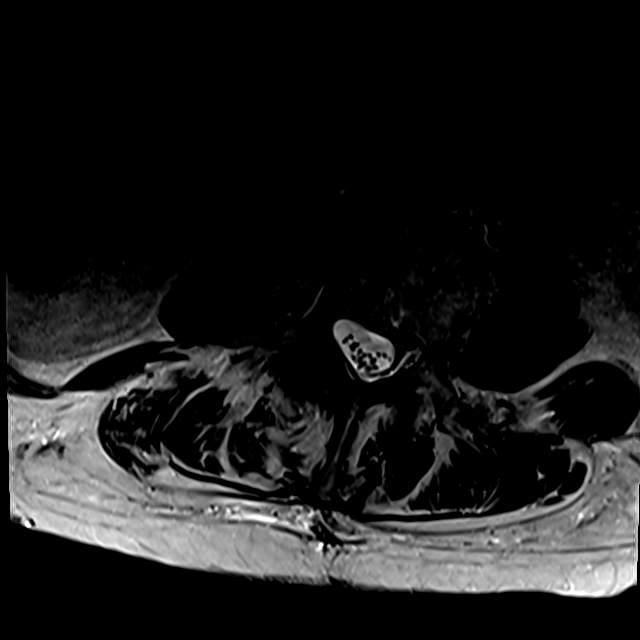
[im 33/39]
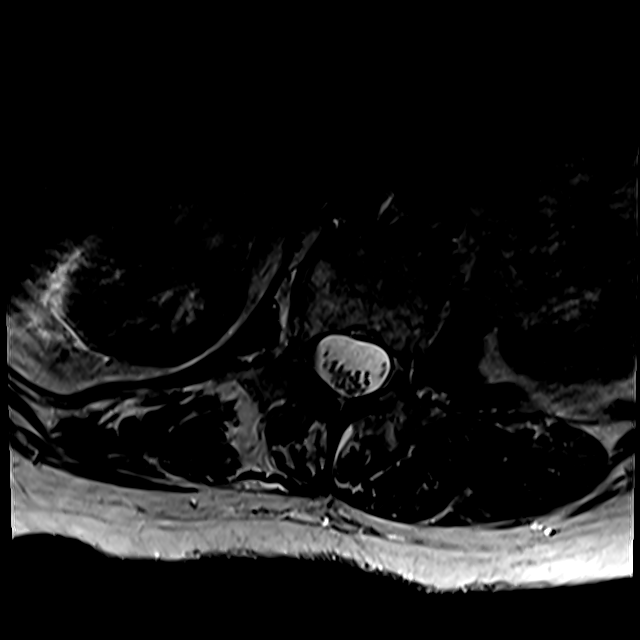

[Series 15: T1 · axial · 4.0mm · 0.28mm/px · z∈[-4,+143]mm · 3 of 39 slices shown (2 of 2)]
[im 6/39]
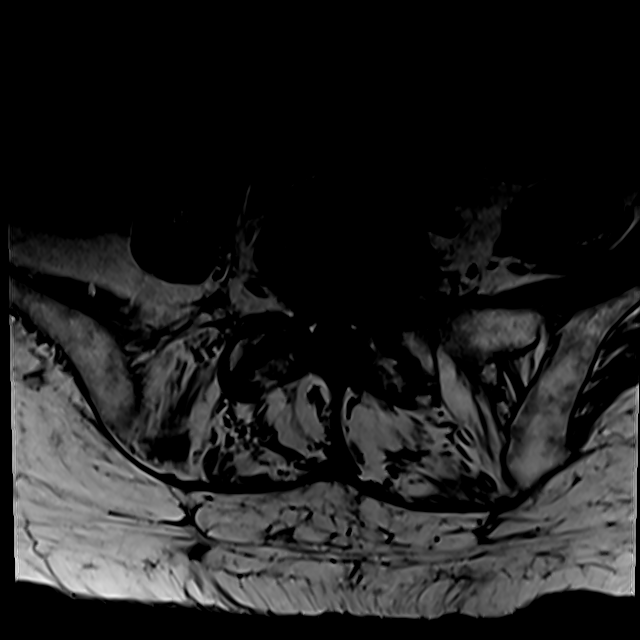
[im 21/39]
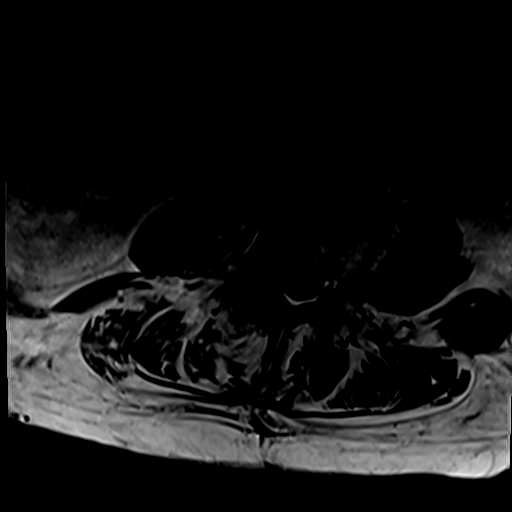
[im 33/39]
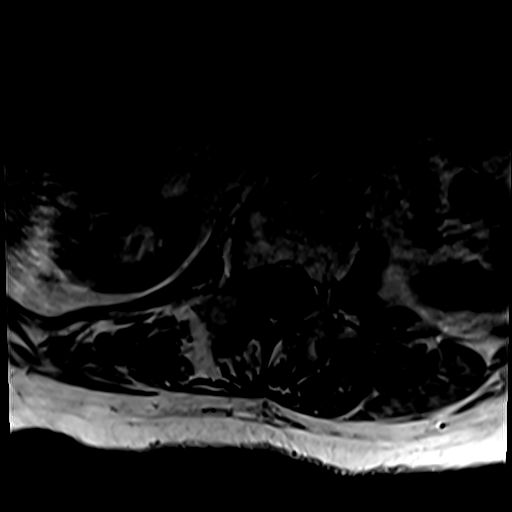

[18 of 48 positions shown; findings below may reference images not displayed]

FINDINGS: Segmentation: Lumbar segmentation appears to be normal and will be
designated as such for this report.

Alignment: Moderate dextroconvex lumbar scoliosis. Overall mild
straightening of lumbar lordosis. But there is mild grade 1
anterolisthesis of L3 on L4. Subtle retrolisthesis of L1 on L2.

Vertebrae: No marrow edema or evidence of acute osseous abnormality.
Visualized bone marrow signal is within normal limits. Intact
visible sacrum and SI joints.

Conus medullaris and cauda equina: Conus extends to the T12 level.
No lower spinal cord or conus signal abnormality.

Paraspinal and other soft tissues: Partially visible benign
appearing left renal cysts. Other visible abdominal viscera appear
negative. Posterior lumbar paraspinal muscle atrophy.

Disc levels:

No spinal stenosis in the visible lower thoracic spine. Mild disc
bulging eccentric to the right at T12-L1.

L1-L2: Right eccentric and far lateral disc osteophyte complex with
mild facet hypertrophy. No spinal or lateral recess stenosis.
Moderate right L1 foraminal stenosis.

L2-L3: Right eccentric circumferential disc bulge and endplate
spurring with up to moderate facet and ligament flavum hypertrophy
greater on the right. Mild right lateral recess stenosis. No spinal
stenosis. Moderate right L2 foraminal stenosis.

L3-L4: Grade 1 anterolisthesis with right eccentric circumferential
disc/pseudo disc. Moderate to severe facet and ligament flavum
hypertrophy. Moderate spinal stenosis (series 12, image 23) with
moderate to severe lateral recess stenosis greater on the right
(right L4 nerve level). Moderate to severe right L3 foraminal
stenosis.

L4-L5: Left eccentric circumferential disc osteophyte complex with
previous right laminectomy and moderate to severe residual facet and
ligament flavum hypertrophy greater on the left. No spinal stenosis.
Mild to moderate residual left lateral recess stenosis. Moderate to
severe left L4 foraminal stenosis.

L5-S1: Possible prior posterior laminectomy here with moderate to
severe residual facet hypertrophy. Left eccentric disc bulge and
endplate spurring. No spinal stenosis. Mild to moderate left lateral
recess stenosis. Moderate left L5 foraminal stenosis.
IMPRESSION: 1. Dextroconvex lumbar scoliosis with superimposed grade 1
anterolisthesis at L3-L4.
Rightward disc degeneration plus moderate to severe facet
hypertrophy there with moderate to severe multifactorial spinal,
right lateral recess, and right foraminal stenosis. Query right L3
and/or L4 radiculitis.
2. Moderate multifactorial right side neural foraminal stenosis also
at the L1 and L2 nerve levels.
3. Previous laminectomies at L4-L5 and possibly also L5-S1. Left
eccentric disc, endplate and posterior element degeneration at those
levels with left side lateral recess and foraminal stenosis.
